# Patient Record
Sex: Female | Born: 1975 | Hispanic: Yes | Marital: Single | Smoking: Never smoker
Health system: Southern US, Community
[De-identification: ages and names within clinical notes are randomized; demographics above are authoritative.]

## PROBLEM LIST (undated history)

## (undated) DIAGNOSIS — E079 Disorder of thyroid, unspecified: Secondary | ICD-10-CM

## (undated) DIAGNOSIS — R7303 Prediabetes: Secondary | ICD-10-CM

## (undated) HISTORY — PX: CHOLECYSTECTOMY: SHX55

---

## 2007-03-22 ENCOUNTER — Ambulatory Visit: Payer: Self-pay | Admitting: General Surgery

## 2007-04-13 ENCOUNTER — Ambulatory Visit: Payer: Self-pay | Admitting: General Surgery

## 2007-04-15 ENCOUNTER — Ambulatory Visit: Payer: Self-pay | Admitting: General Surgery

## 2009-05-01 ENCOUNTER — Ambulatory Visit: Payer: Self-pay | Admitting: Advanced Practice Midwife

## 2009-05-21 ENCOUNTER — Ambulatory Visit: Payer: Self-pay | Admitting: Obstetrics and Gynecology

## 2009-05-22 ENCOUNTER — Inpatient Hospital Stay: Payer: Self-pay

## 2014-08-07 ENCOUNTER — Ambulatory Visit: Payer: Self-pay | Admitting: Obstetrics and Gynecology

## 2014-08-14 ENCOUNTER — Ambulatory Visit: Payer: Self-pay | Admitting: Obstetrics and Gynecology

## 2014-09-19 ENCOUNTER — Ambulatory Visit: Payer: Self-pay | Admitting: Obstetrics and Gynecology

## 2015-04-25 ENCOUNTER — Encounter: Payer: Self-pay | Admitting: Emergency Medicine

## 2015-04-25 ENCOUNTER — Emergency Department
Admission: EM | Admit: 2015-04-25 | Discharge: 2015-04-25 | Discharge status: Against Medical Advice | Payer: BLUE CROSS/BLUE SHIELD | Attending: Emergency Medicine | Admitting: Emergency Medicine

## 2015-04-25 DIAGNOSIS — R110 Nausea: Secondary | ICD-10-CM | POA: Insufficient documentation

## 2015-04-25 DIAGNOSIS — Z9049 Acquired absence of other specified parts of digestive tract: Secondary | ICD-10-CM | POA: Diagnosis not present

## 2015-04-25 DIAGNOSIS — R1010 Upper abdominal pain, unspecified: Secondary | ICD-10-CM | POA: Insufficient documentation

## 2015-04-25 LAB — CBC
HCT: 41.4 % (ref 35.0–47.0)
Hemoglobin: 13.7 g/dL (ref 12.0–16.0)
MCH: 27.7 pg (ref 26.0–34.0)
MCHC: 33 g/dL (ref 32.0–36.0)
MCV: 83.9 fL (ref 80.0–100.0)
PLATELETS: 315 10*3/uL (ref 150–440)
RBC: 4.93 MIL/uL (ref 3.80–5.20)
RDW: 13.5 % (ref 11.5–14.5)
WBC: 7.9 10*3/uL (ref 3.6–11.0)

## 2015-04-25 LAB — URINALYSIS COMPLETE WITH MICROSCOPIC (ARMC ONLY)
BILIRUBIN URINE: NEGATIVE
Glucose, UA: NEGATIVE mg/dL
Ketones, ur: NEGATIVE mg/dL
Leukocytes, UA: NEGATIVE
Nitrite: NEGATIVE
PH: 5 (ref 5.0–8.0)
Protein, ur: NEGATIVE mg/dL
SPECIFIC GRAVITY, URINE: 1.017 (ref 1.005–1.030)

## 2015-04-25 LAB — COMPREHENSIVE METABOLIC PANEL
ALT: 15 U/L (ref 14–54)
ANION GAP: 7 (ref 5–15)
AST: 17 U/L (ref 15–41)
Albumin: 4.3 g/dL (ref 3.5–5.0)
Alkaline Phosphatase: 65 U/L (ref 38–126)
BUN: 6 mg/dL (ref 6–20)
CALCIUM: 8.6 mg/dL — AB (ref 8.9–10.3)
CO2: 26 mmol/L (ref 22–32)
Chloride: 105 mmol/L (ref 101–111)
Creatinine, Ser: 0.61 mg/dL (ref 0.44–1.00)
GFR calc non Af Amer: >60 mL/min (ref >60)
GLUCOSE: 99 mg/dL (ref 65–99)
Potassium: 3.7 mmol/L (ref 3.5–5.1)
Sodium: 138 mmol/L (ref 135–145)
TOTAL PROTEIN: 7.7 g/dL (ref 6.5–8.1)
Total Bilirubin: 0.3 mg/dL (ref 0.3–1.2)

## 2015-04-25 LAB — LIPASE, BLOOD: Lipase: 28 U/L (ref 22–51)

## 2015-04-25 NOTE — ED Notes (Signed)
Patient to ED with c/o upper abdominal pain for 3 days, has history of gall bladder removal. Patient c/o constipation. +nausea when she eats.

## 2017-11-30 DIAGNOSIS — M9901 Segmental and somatic dysfunction of cervical region: Secondary | ICD-10-CM | POA: Diagnosis not present

## 2017-11-30 DIAGNOSIS — R51 Headache: Secondary | ICD-10-CM | POA: Diagnosis not present

## 2017-11-30 DIAGNOSIS — M5412 Radiculopathy, cervical region: Secondary | ICD-10-CM | POA: Diagnosis not present

## 2017-12-01 DIAGNOSIS — M5412 Radiculopathy, cervical region: Secondary | ICD-10-CM | POA: Diagnosis not present

## 2017-12-01 DIAGNOSIS — R51 Headache: Secondary | ICD-10-CM | POA: Diagnosis not present

## 2017-12-01 DIAGNOSIS — M9901 Segmental and somatic dysfunction of cervical region: Secondary | ICD-10-CM | POA: Diagnosis not present

## 2017-12-02 DIAGNOSIS — M9901 Segmental and somatic dysfunction of cervical region: Secondary | ICD-10-CM | POA: Diagnosis not present

## 2017-12-02 DIAGNOSIS — M5412 Radiculopathy, cervical region: Secondary | ICD-10-CM | POA: Diagnosis not present

## 2017-12-02 DIAGNOSIS — R51 Headache: Secondary | ICD-10-CM | POA: Diagnosis not present

## 2017-12-03 DIAGNOSIS — M5412 Radiculopathy, cervical region: Secondary | ICD-10-CM | POA: Diagnosis not present

## 2017-12-03 DIAGNOSIS — M9901 Segmental and somatic dysfunction of cervical region: Secondary | ICD-10-CM | POA: Diagnosis not present

## 2017-12-03 DIAGNOSIS — R51 Headache: Secondary | ICD-10-CM | POA: Diagnosis not present

## 2017-12-06 DIAGNOSIS — R51 Headache: Secondary | ICD-10-CM | POA: Diagnosis not present

## 2017-12-06 DIAGNOSIS — M9901 Segmental and somatic dysfunction of cervical region: Secondary | ICD-10-CM | POA: Diagnosis not present

## 2017-12-06 DIAGNOSIS — M5412 Radiculopathy, cervical region: Secondary | ICD-10-CM | POA: Diagnosis not present

## 2017-12-08 DIAGNOSIS — M9901 Segmental and somatic dysfunction of cervical region: Secondary | ICD-10-CM | POA: Diagnosis not present

## 2017-12-08 DIAGNOSIS — M5412 Radiculopathy, cervical region: Secondary | ICD-10-CM | POA: Diagnosis not present

## 2017-12-08 DIAGNOSIS — R51 Headache: Secondary | ICD-10-CM | POA: Diagnosis not present

## 2017-12-09 DIAGNOSIS — R51 Headache: Secondary | ICD-10-CM | POA: Diagnosis not present

## 2017-12-09 DIAGNOSIS — M5412 Radiculopathy, cervical region: Secondary | ICD-10-CM | POA: Diagnosis not present

## 2017-12-09 DIAGNOSIS — M9901 Segmental and somatic dysfunction of cervical region: Secondary | ICD-10-CM | POA: Diagnosis not present

## 2017-12-13 DIAGNOSIS — M5412 Radiculopathy, cervical region: Secondary | ICD-10-CM | POA: Diagnosis not present

## 2017-12-13 DIAGNOSIS — R51 Headache: Secondary | ICD-10-CM | POA: Diagnosis not present

## 2017-12-13 DIAGNOSIS — M9901 Segmental and somatic dysfunction of cervical region: Secondary | ICD-10-CM | POA: Diagnosis not present

## 2017-12-15 DIAGNOSIS — R51 Headache: Secondary | ICD-10-CM | POA: Diagnosis not present

## 2017-12-15 DIAGNOSIS — M9901 Segmental and somatic dysfunction of cervical region: Secondary | ICD-10-CM | POA: Diagnosis not present

## 2017-12-15 DIAGNOSIS — M5412 Radiculopathy, cervical region: Secondary | ICD-10-CM | POA: Diagnosis not present

## 2017-12-20 DIAGNOSIS — M9901 Segmental and somatic dysfunction of cervical region: Secondary | ICD-10-CM | POA: Diagnosis not present

## 2017-12-20 DIAGNOSIS — M5412 Radiculopathy, cervical region: Secondary | ICD-10-CM | POA: Diagnosis not present

## 2017-12-20 DIAGNOSIS — R51 Headache: Secondary | ICD-10-CM | POA: Diagnosis not present

## 2017-12-22 DIAGNOSIS — M9901 Segmental and somatic dysfunction of cervical region: Secondary | ICD-10-CM | POA: Diagnosis not present

## 2017-12-22 DIAGNOSIS — R51 Headache: Secondary | ICD-10-CM | POA: Diagnosis not present

## 2017-12-22 DIAGNOSIS — M5412 Radiculopathy, cervical region: Secondary | ICD-10-CM | POA: Diagnosis not present

## 2017-12-23 DIAGNOSIS — M9901 Segmental and somatic dysfunction of cervical region: Secondary | ICD-10-CM | POA: Diagnosis not present

## 2017-12-23 DIAGNOSIS — R51 Headache: Secondary | ICD-10-CM | POA: Diagnosis not present

## 2017-12-23 DIAGNOSIS — M5412 Radiculopathy, cervical region: Secondary | ICD-10-CM | POA: Diagnosis not present

## 2017-12-27 DIAGNOSIS — R51 Headache: Secondary | ICD-10-CM | POA: Diagnosis not present

## 2017-12-27 DIAGNOSIS — M9901 Segmental and somatic dysfunction of cervical region: Secondary | ICD-10-CM | POA: Diagnosis not present

## 2017-12-27 DIAGNOSIS — M5412 Radiculopathy, cervical region: Secondary | ICD-10-CM | POA: Diagnosis not present

## 2017-12-29 DIAGNOSIS — M5412 Radiculopathy, cervical region: Secondary | ICD-10-CM | POA: Diagnosis not present

## 2017-12-29 DIAGNOSIS — M9901 Segmental and somatic dysfunction of cervical region: Secondary | ICD-10-CM | POA: Diagnosis not present

## 2017-12-29 DIAGNOSIS — R51 Headache: Secondary | ICD-10-CM | POA: Diagnosis not present

## 2018-01-03 DIAGNOSIS — R51 Headache: Secondary | ICD-10-CM | POA: Diagnosis not present

## 2018-01-03 DIAGNOSIS — M9901 Segmental and somatic dysfunction of cervical region: Secondary | ICD-10-CM | POA: Diagnosis not present

## 2018-01-03 DIAGNOSIS — M5412 Radiculopathy, cervical region: Secondary | ICD-10-CM | POA: Diagnosis not present

## 2018-01-05 DIAGNOSIS — R51 Headache: Secondary | ICD-10-CM | POA: Diagnosis not present

## 2018-01-05 DIAGNOSIS — M5412 Radiculopathy, cervical region: Secondary | ICD-10-CM | POA: Diagnosis not present

## 2018-01-05 DIAGNOSIS — M9901 Segmental and somatic dysfunction of cervical region: Secondary | ICD-10-CM | POA: Diagnosis not present

## 2018-01-10 DIAGNOSIS — R51 Headache: Secondary | ICD-10-CM | POA: Diagnosis not present

## 2018-01-10 DIAGNOSIS — M5412 Radiculopathy, cervical region: Secondary | ICD-10-CM | POA: Diagnosis not present

## 2018-01-10 DIAGNOSIS — M9901 Segmental and somatic dysfunction of cervical region: Secondary | ICD-10-CM | POA: Diagnosis not present

## 2018-01-13 DIAGNOSIS — R51 Headache: Secondary | ICD-10-CM | POA: Diagnosis not present

## 2018-01-13 DIAGNOSIS — M9901 Segmental and somatic dysfunction of cervical region: Secondary | ICD-10-CM | POA: Diagnosis not present

## 2018-01-13 DIAGNOSIS — M5412 Radiculopathy, cervical region: Secondary | ICD-10-CM | POA: Diagnosis not present

## 2018-01-17 DIAGNOSIS — M9901 Segmental and somatic dysfunction of cervical region: Secondary | ICD-10-CM | POA: Diagnosis not present

## 2018-01-17 DIAGNOSIS — M5412 Radiculopathy, cervical region: Secondary | ICD-10-CM | POA: Diagnosis not present

## 2018-01-17 DIAGNOSIS — R51 Headache: Secondary | ICD-10-CM | POA: Diagnosis not present

## 2018-01-20 DIAGNOSIS — R51 Headache: Secondary | ICD-10-CM | POA: Diagnosis not present

## 2018-01-20 DIAGNOSIS — M9901 Segmental and somatic dysfunction of cervical region: Secondary | ICD-10-CM | POA: Diagnosis not present

## 2018-01-20 DIAGNOSIS — M5412 Radiculopathy, cervical region: Secondary | ICD-10-CM | POA: Diagnosis not present

## 2018-06-10 DIAGNOSIS — Z0000 Encounter for general adult medical examination without abnormal findings: Secondary | ICD-10-CM | POA: Diagnosis not present

## 2018-06-27 DIAGNOSIS — B373 Candidiasis of vulva and vagina: Secondary | ICD-10-CM | POA: Diagnosis not present

## 2018-08-10 DIAGNOSIS — M9902 Segmental and somatic dysfunction of thoracic region: Secondary | ICD-10-CM | POA: Diagnosis not present

## 2018-08-10 DIAGNOSIS — M62830 Muscle spasm of back: Secondary | ICD-10-CM | POA: Diagnosis not present

## 2018-08-10 DIAGNOSIS — M5431 Sciatica, right side: Secondary | ICD-10-CM | POA: Diagnosis not present

## 2018-08-13 DIAGNOSIS — M5441 Lumbago with sciatica, right side: Secondary | ICD-10-CM | POA: Diagnosis not present

## 2018-11-16 DIAGNOSIS — R05 Cough: Secondary | ICD-10-CM | POA: Diagnosis not present

## 2018-11-16 DIAGNOSIS — J069 Acute upper respiratory infection, unspecified: Secondary | ICD-10-CM | POA: Diagnosis not present

## 2018-12-05 ENCOUNTER — Other Ambulatory Visit: Payer: Self-pay | Admitting: Internal Medicine

## 2018-12-05 DIAGNOSIS — Z1231 Encounter for screening mammogram for malignant neoplasm of breast: Secondary | ICD-10-CM

## 2018-12-05 DIAGNOSIS — M79671 Pain in right foot: Secondary | ICD-10-CM | POA: Diagnosis not present

## 2018-12-05 DIAGNOSIS — M79641 Pain in right hand: Secondary | ICD-10-CM | POA: Diagnosis not present

## 2018-12-05 DIAGNOSIS — M25551 Pain in right hip: Secondary | ICD-10-CM | POA: Diagnosis not present

## 2018-12-08 DIAGNOSIS — G8929 Other chronic pain: Secondary | ICD-10-CM | POA: Diagnosis not present

## 2018-12-08 DIAGNOSIS — Z7689 Persons encountering health services in other specified circumstances: Secondary | ICD-10-CM | POA: Diagnosis not present

## 2018-12-08 DIAGNOSIS — R7309 Other abnormal glucose: Secondary | ICD-10-CM | POA: Diagnosis not present

## 2018-12-08 DIAGNOSIS — Z13220 Encounter for screening for lipoid disorders: Secondary | ICD-10-CM | POA: Diagnosis not present

## 2018-12-08 DIAGNOSIS — M25551 Pain in right hip: Secondary | ICD-10-CM | POA: Diagnosis not present

## 2019-03-16 ENCOUNTER — Other Ambulatory Visit: Payer: Self-pay

## 2019-03-16 ENCOUNTER — Ambulatory Visit
Admission: RE | Admit: 2019-03-16 | Discharge: 2019-03-16 | Disposition: A | Discharge status: Home / Self Care | Payer: 59 | Source: Ambulatory Visit | Attending: Internal Medicine | Admitting: Internal Medicine

## 2019-03-16 DIAGNOSIS — Z1231 Encounter for screening mammogram for malignant neoplasm of breast: Secondary | ICD-10-CM | POA: Insufficient documentation

## 2019-05-24 ENCOUNTER — Other Ambulatory Visit: Payer: Self-pay

## 2019-05-24 ENCOUNTER — Encounter: Payer: Self-pay | Admitting: Emergency Medicine

## 2019-05-24 ENCOUNTER — Emergency Department: Payer: 59

## 2019-05-24 ENCOUNTER — Emergency Department
Admission: EM | Admit: 2019-05-24 | Discharge: 2019-05-24 | Disposition: A | Discharge status: Home / Self Care | Payer: 59 | Attending: Student | Admitting: Student

## 2019-05-24 DIAGNOSIS — O4690 Antepartum hemorrhage, unspecified, unspecified trimester: Secondary | ICD-10-CM

## 2019-05-24 DIAGNOSIS — R8271 Bacteriuria: Secondary | ICD-10-CM

## 2019-05-24 DIAGNOSIS — Z3A00 Weeks of gestation of pregnancy not specified: Secondary | ICD-10-CM | POA: Diagnosis not present

## 2019-05-24 DIAGNOSIS — O209 Hemorrhage in early pregnancy, unspecified: Secondary | ICD-10-CM | POA: Diagnosis not present

## 2019-05-24 LAB — URINALYSIS, COMPLETE (UACMP) WITH MICROSCOPIC
Bilirubin Urine: NEGATIVE
Glucose, UA: NEGATIVE mg/dL
Hgb urine dipstick: NEGATIVE
Ketones, ur: NEGATIVE mg/dL
Leukocytes,Ua: NEGATIVE
Nitrite: NEGATIVE
Protein, ur: NEGATIVE mg/dL
Specific Gravity, Urine: 1.019 (ref 1.005–1.030)
pH: 6 (ref 5.0–8.0)

## 2019-05-24 LAB — CBC
HCT: 41.3 % (ref 36.0–46.0)
Hemoglobin: 13.3 g/dL (ref 12.0–15.0)
MCH: 28.2 pg (ref 26.0–34.0)
MCHC: 32.2 g/dL (ref 30.0–36.0)
MCV: 87.7 fL (ref 80.0–100.0)
Platelets: 367 10*3/uL (ref 150–400)
RBC: 4.71 MIL/uL (ref 3.87–5.11)
RDW: 13.7 % (ref 11.5–15.5)
WBC: 10.6 10*3/uL — ABNORMAL HIGH (ref 4.0–10.5)
nRBC: 0 % (ref 0.0–0.2)

## 2019-05-24 LAB — COMPREHENSIVE METABOLIC PANEL
ALT: 15 U/L (ref 0–44)
AST: 15 U/L (ref 15–41)
Albumin: 4 g/dL (ref 3.5–5.0)
Alkaline Phosphatase: 58 U/L (ref 38–126)
Anion gap: 8 (ref 5–15)
BUN: 17 mg/dL (ref 6–20)
CO2: 22 mmol/L (ref 22–32)
Calcium: 9.1 mg/dL (ref 8.9–10.3)
Chloride: 107 mmol/L (ref 98–111)
Creatinine, Ser: 0.63 mg/dL (ref 0.44–1.00)
GFR calc Af Amer: >60 mL/min (ref >60)
GFR calc non Af Amer: >60 mL/min (ref >60)
Glucose, Bld: 107 mg/dL — ABNORMAL HIGH (ref 70–99)
Potassium: 4 mmol/L (ref 3.5–5.1)
Sodium: 137 mmol/L (ref 135–145)
Total Bilirubin: 0.5 mg/dL (ref 0.3–1.2)
Total Protein: 7.1 g/dL (ref 6.5–8.1)

## 2019-05-24 LAB — WET PREP, GENITAL
Clue Cells Wet Prep HPF POC: NONE SEEN
Sperm: NONE SEEN
Trich, Wet Prep: NONE SEEN
Yeast Wet Prep HPF POC: NONE SEEN

## 2019-05-24 LAB — POCT PREGNANCY, URINE: Preg Test, Ur: POSITIVE — AB

## 2019-05-24 LAB — HCG, QUANTITATIVE, PREGNANCY: hCG, Beta Chain, Quant, S: 5742 m[IU]/mL — ABNORMAL HIGH (ref <5)

## 2019-05-24 MED ORDER — CEPHALEXIN 500 MG PO CAPS: 500.0000 mg | ORAL_CAPSULE | Freq: Two times a day (BID) | ORAL | 0 refills | Status: AC

## 2019-05-24 NOTE — ED Notes (Signed)
Interpretor requested 

## 2019-05-24 NOTE — ED Notes (Signed)
MD Monks and this RN at bedside for update and discharge with interpretor on a stick

## 2019-05-24 NOTE — Discharge Instructions (Signed)
You for letting us take care of you in the emergency department today.  It is important that you follow-up with OB/GYN in 2 days.  Someone from [facility] will call you to make this appointment. Their address is [address], [place], [place] [address]. Their phone number is: [phone].  This appointment is important for follow-up of the status of your pregnancy.  You also had bacteria in your urine, for this we will send you home with antibiotics.  Please return to the emergency department for any new or worsening symptoms.   Usted por dejarnos atenderlo en el departamento de emergencias hoy.  Es importante que realice un seguimiento con su Producer, television/film/video / gineclogo en 2 das. Alguien de [facility] la llamar para programar esta cita. Su direccin es [address], [place], [place] [address]. Su nmero de telfono es: [phone].  Esta cita es importante para el seguimiento del [place] de su [place].  Tambin tenas bacterias en tu orina, para esto te enviaremos a casa con antibiticos.  Regrese al departamento de emergencias por cualquier sntoma nuevo o que empeore.

## 2019-05-24 NOTE — ED Triage Notes (Addendum)
Positive preg last week.  Spotting since Monday( brownish)  Also cramping

## 2019-05-24 NOTE — ED Provider Notes (Signed)
[facility] Emergency Department Provider Note  ____________________________________________   First MD Initiated Contact with Patient 05/24/19 1105     (approximate)  I have reviewed the triage vital signs and the nursing notes.  History  Chief Complaint Vaginal Bleeding    HPI Becky Garrett is a 43 y.o. female [phone] [id number] who presents to the emergency department for vaginal bleeding in the setting of pregnancy.  Patient states her last menstrual period was on 03/18/19.  She had a positive home pregnancy test 2 weeks ago.  On Sunday, she began noticing a small amount of brown discharge, only while wiping.  No brisk bleeding or clots.  She has not had to wear any pads or tampons.  She is not on any anticoagulation.  She denies any other vaginal discharge.  History obtained with the assistance of an interpreter.         Past Medical Hx History reviewed. No pertinent past medical history.  Problem List There are no active problems to display for this patient.   Past Surgical Hx Past Surgical History:  Procedure Laterality Date  . CHOLECYSTECTOMY      Medications Prior to Admission medications   Not on File    Allergies Patient has no known allergies.  Family Hx No family history on file.  Social Hx Social History   Tobacco Use  . Smoking status: Never Smoker  . Smokeless tobacco: Never Used  Substance Use Topics  . Alcohol use: No  . Drug use: No     Review of Systems  Constitutional: Negative for fever. Negative for chills. Eyes: Negative for visual changes. ENT: Negative for sore throat. Cardiovascular: Negative for chest pain. Respiratory: Negative for shortness of breath. Gastrointestinal: Negative for abdominal pain. Negative for nausea. Negative for vomiting. Genitourinary: Negative for dysuria. + vaginal bleeding Musculoskeletal: Negative for leg swelling. Skin: Negative for rash. Neurological: Negative for  for headaches.   Physical Exam  Vital Signs: ED Triage Vitals [05/24/19 0938]  Enc Vitals Group     BP 117/76     Pulse Rate 86     Resp 16     Temp 98.5 F (36.9 C)     Temp Source Oral     SpO2 100 %     Weight      Height      Head Circumference      Peak Flow      Pain Score 4     Pain Loc      Pain Edu?      Excl. in GC?     Constitutional: Alert and oriented.  Eyes: Conjunctivae clear. Sclera anicteric. Head: Normocephalic. Atraumatic. Nose: No congestion. No rhinorrhea. Mouth/Throat: Mucous membranes are moist.  Neck: No stridor.   Cardiovascular: Normal rate, regular rhythm. No murmurs. Extremities well perfused. Respiratory: Normal respiratory effort.  Lungs CTAB. Gastrointestinal: Soft and non-tender. No distention.  Pelvic: RN chaperone present, scant amount of brown blood, mucus in vaginal canal.  No active bleeding.  No clots.  Cervix appears visually closed. Musculoskeletal: No lower extremity edema. Neurologic:  Normal speech and language. No gross focal neurologic deficits are appreciated.  Skin: Skin is warm, dry and intact. No rash noted. Psychiatric: Mood and affect are appropriate for situation.  EKG  N/A    Radiology  US: IMPRESSION: Gestational sac without definitive fetal pole or yolk sac. Correlation with beta HCG levels is recommended. Short-term follow-up may be helpful.     Procedures  Procedure(s)  performed (including critical care):  Procedures   Initial Impression / Assessment and Plan / ED Course  43 y.o. female who presents to the ED for vaginal bleeding and pregnancy.  LMP 7/4.  Had a positive home UPT approximately 2 weeks ago.  History as above.  Ddx: threatened miscarriage, miscarriage, ectopic  Plan: Labs, ultrasound  Labs reveal asymptomatic bacteriuria, given she is pregnant we will plan to treat.  Wet prep is negative.  Ultrasound notable for gestational sac, without a definitive fetal pole or yolk sac.   Updated patient on results and explained that she had an pregnancy of unknown viability, which will require close follow-up with OB/GYN.  Discussed with [facility], who will plan to see the patient in 2 days in clinic for follow-up and recheck.  Updated patient on this plan of care with the assistance of an interpreter.  She voices understanding and is comfortable with the plan and discharge.  All questions answered.    Final Clinical Impression(s) / ED Diagnosis  Final diagnoses:  Vaginal bleeding in pregnancy  Bacteria in urine       Note:  This document was prepared using Dragon voice recognition software and may include unintentional dictation errors.   Lilia Pro., MD 05/24/19 [phone]

## 2019-05-25 ENCOUNTER — Encounter: Payer: Self-pay | Admitting: Emergency Medicine

## 2019-05-25 ENCOUNTER — Other Ambulatory Visit: Payer: Self-pay

## 2019-05-25 ENCOUNTER — Emergency Department
Admission: EM | Admit: 2019-05-25 | Discharge: 2019-05-25 | Disposition: A | Discharge status: Home / Self Care | Payer: 59 | Attending: Emergency Medicine | Admitting: Emergency Medicine

## 2019-05-25 DIAGNOSIS — N939 Abnormal uterine and vaginal bleeding, unspecified: Secondary | ICD-10-CM | POA: Diagnosis not present

## 2019-05-25 DIAGNOSIS — Z5321 Procedure and treatment not carried out due to patient leaving prior to being seen by health care provider: Secondary | ICD-10-CM | POA: Diagnosis not present

## 2019-05-25 NOTE — ED Triage Notes (Signed)
Pt seen here yesterday for cramping and brownish discharge, today, red vaginal bleeding today and more pain. NAD. Pt states this is her 4th pregnancy and hasn't bled like this before.

## 2019-05-26 LAB — GC/CHLAMYDIA PROBE AMP
Chlamydia trachomatis, NAA: NEGATIVE
Neisseria Gonorrhoeae by PCR: NEGATIVE

## 2020-03-11 ENCOUNTER — Other Ambulatory Visit: Payer: Self-pay

## 2020-03-11 ENCOUNTER — Ambulatory Visit: Payer: Self-pay

## 2020-03-11 DIAGNOSIS — Z021 Encounter for pre-employment examination: Secondary | ICD-10-CM

## 2020-03-11 LAB — POCT URINE DRUG SCREEN
POC Amphetamine UR: NOT DETECTED
POC Cocaine UR: NOT DETECTED
POC Methamphetamine UR: NOT DETECTED
POC Opiate Ur: NOT DETECTED
POC PHENCYCLIDINE UR: NOT DETECTED
URINE TEMPERATURE: 92 Degrees F (ref 90.0–100.0)

## 2020-09-03 ENCOUNTER — Other Ambulatory Visit: Payer: Self-pay | Admitting: Obstetrics and Gynecology

## 2020-09-03 DIAGNOSIS — Z1231 Encounter for screening mammogram for malignant neoplasm of breast: Secondary | ICD-10-CM

## 2020-09-18 ENCOUNTER — Encounter: Payer: Self-pay | Admitting: Emergency Medicine

## 2020-09-18 ENCOUNTER — Other Ambulatory Visit: Payer: Self-pay

## 2020-09-18 ENCOUNTER — Ambulatory Visit
Admission: EM | Admit: 2020-09-18 | Discharge: 2020-09-18 | Disposition: A | Discharge status: Home / Self Care | Payer: BC Managed Care – PPO | Attending: Emergency Medicine | Admitting: Emergency Medicine

## 2020-09-18 DIAGNOSIS — R590 Localized enlarged lymph nodes: Secondary | ICD-10-CM | POA: Diagnosis present

## 2020-09-18 DIAGNOSIS — B349 Viral infection, unspecified: Secondary | ICD-10-CM | POA: Diagnosis present

## 2020-09-18 DIAGNOSIS — Z20822 Contact with and (suspected) exposure to covid-19: Secondary | ICD-10-CM | POA: Insufficient documentation

## 2020-09-18 NOTE — Discharge Instructions (Addendum)
Use over-the-counter Tylenol and ibuprofen as needed for pain control.  If you develop any trouble breathing return for reevaluation or go to the ER.  You will need to isolate at home until the results of the Covid test come back.  If you are positive you will have to quarantine for 10 days from the start of your symptoms.  After 10 days you can break quarantine if your symptoms have improved and you have not run a fever for 24 hours.

## 2020-09-18 NOTE — ED Provider Notes (Signed)
[facility]    CSN: [id number] Arrival date & time: 09/18/20  1524      History   Chief Complaint Chief Complaint  Patient presents with  . Generalized Body Aches    HPI Becky Garrett is a 45 y.o. female.   HPI   45 year old Hispanic speaking female here for evaluation of generalized body aches and left-sided neck swelling.  Patient reports that she woke up this morning with swelling to left side of her neck that was very painful.  Patient states that it hurts to swallow.  Patient also reports that she has had some associated fatigue.  Patient denies fever, runny nose, trouble swallowing, cough, GI complaints, or abdominal pain.  Patient was exposed to Covid around Christmas time at a party.  Currently her husband and son have similar symptoms along with nausea and vomiting.  Patient has been vaccinated against Covid but has not received her booster shot.  Spanish interpreter [place] [phone] assisted in HPI, assessment, and discharge.  History reviewed. No pertinent past medical history.  There are no problems to display for this patient.   Past Surgical History:  Procedure Laterality Date  . CHOLECYSTECTOMY      OB History   No obstetric history on file.      Home Medications    Prior to Admission medications   Not on File    Family History History reviewed. No pertinent family history.  Social History Social History   Tobacco Use  . Smoking status: Never Smoker  . Smokeless tobacco: Never Used  Substance Use Topics  . Alcohol use: No  . Drug use: No     Allergies   Patient has no known allergies.   Review of Systems Review of Systems  Constitutional: Negative for fever.  HENT: Negative for congestion, rhinorrhea and sore throat.   Respiratory: Negative for cough and shortness of breath.   Gastrointestinal: Negative for abdominal pain, diarrhea, nausea and vomiting.  Musculoskeletal: Positive for neck pain. Negative  for arthralgias and myalgias.  Skin: Negative for color change and rash.  Hematological: Positive for adenopathy.  Psychiatric/Behavioral: Negative.      Physical Exam Triage Vital Signs ED Triage Vitals  Enc Vitals Group     BP 09/18/20 1907 (!) 162/95     Pulse Rate 09/18/20 1907 (!) 101     Resp 09/18/20 1907 18     Temp 09/18/20 1907 98.5 F (36.9 C)     Temp Source 09/18/20 1907 Oral     SpO2 --      Weight 09/18/20 1905 207 lb 14.3 oz (94.3 kg)     Height 09/18/20 1905 5' (1.524 m)     Head Circumference --      Peak Flow --      Pain Score 09/18/20 1904 7     Pain Loc --      Pain Edu? --      Excl. in GC? --    No data found.  Updated Vital Signs BP (!) 162/95 (BP Location: Left Arm)   Pulse (!) 101   Temp 98.5 F (36.9 C) (Oral)   Resp 18   Ht 5' (1.524 m)   Wt 207 lb 14.3 oz (94.3 kg)   LMP 09/10/2020   BMI 40.60 kg/m   Visual Acuity Right Eye Distance:   Left Eye Distance:   Bilateral Distance:    Right Eye Near:   Left Eye Near:    Bilateral Near:  Physical Exam Vitals and nursing note reviewed.  Constitutional:      General: She is not in acute distress.    Appearance: Normal appearance.  HENT:     Head: Normocephalic and atraumatic.     Right Ear: Tympanic membrane, ear canal and external ear normal.     Left Ear: Tympanic membrane, ear canal and external ear normal.     Nose: Nose normal. No congestion or rhinorrhea.     Mouth/Throat:     Mouth: Mucous membranes are moist.     Pharynx: Oropharynx is clear. No posterior oropharyngeal erythema.  Cardiovascular:     Rate and Rhythm: Normal rate and regular rhythm.     Pulses: Normal pulses.     Heart sounds: Normal heart sounds. No murmur heard. No gallop.   Pulmonary:     Effort: Pulmonary effort is normal.     Breath sounds: Normal breath sounds. No stridor. No wheezing, rhonchi or rales.  Musculoskeletal:     Cervical back: Normal range of motion and neck supple. Tenderness  present.  Lymphadenopathy:     Cervical: Cervical adenopathy present.  Skin:    General: Skin is warm and dry.     Capillary Refill: Capillary refill takes less than 2 seconds.     Findings: No erythema or rash.  Neurological:     General: No focal deficit present.     Mental Status: She is alert and oriented to person, place, and time.  Psychiatric:        Mood and Affect: Mood normal.        Behavior: Behavior normal.        Thought Content: Thought content normal.        Judgment: Judgment normal.      UC Treatments / Results  Labs (all labs ordered are listed, but only abnormal results are displayed) Labs Reviewed  SARS CORONAVIRUS 2 (TAT 6-24 HRS)    EKG   Radiology No results found.  Procedures Procedures (including critical care time)  Medications Ordered in UC Medications - No data to display  Initial Impression / Assessment and Plan / UC Course  I have reviewed the triage vital signs and the nursing notes.  Pertinent labs & imaging results that were available during my care of the patient were reviewed by me and considered in my medical decision making (see chart for details).   Is here for evaluation of swelling to the left side of her neck and generalized body aches that started this morning.  Patient does have marked edema to the left lower side of her neck near her clavicle.  The area is freely mobile but tender to touch.  No erythema.  The borders of the swollen area are palpable.  There is no induration or fluctuance noted.  Physical exam of upper respiratory tree is benign, lung sounds are clear and equal bilaterally.  No stridor appreciated when auscultating over the trachea.  Suspect that this is a singular reactive lymph node.  Will swab patient for Covid and discharge her home to isolate.  Patient is able to manage her secretions and is not having any difficulty swallowing or breathing.  We will have her use over-the-counter ibuprofen for pain relief and  have her return for reevaluation or see the ER if her symptoms worsen.   Final Clinical Impressions(s) / UC Diagnoses   Final diagnoses:  Viral illness  Reactive cervical lymphadenopathy     Discharge Instructions     Use  over-the-counter Tylenol and ibuprofen as needed for pain control.  If you develop any trouble breathing return for reevaluation or go to the ER.  You will need to isolate at home until the results of the Covid test come back.  If you are positive you will have to quarantine for 10 days from the start of your symptoms.  After 10 days you can break quarantine if your symptoms have improved and you have not run a fever for 24 hours.    ED Prescriptions    None     PDMP not reviewed this encounter.   Becky Augusta, NP 09/18/20 1945

## 2020-09-18 NOTE — ED Triage Notes (Signed)
Pt c/o body aches and left sided neck swelling. She was exposed to covid.

## 2020-09-19 LAB — SARS CORONAVIRUS 2 (TAT 6-24 HRS): SARS Coronavirus 2: NEGATIVE

## 2020-09-20 ENCOUNTER — Other Ambulatory Visit: Payer: Self-pay

## 2020-09-20 ENCOUNTER — Ambulatory Visit
Admission: RE | Admit: 2020-09-20 | Discharge: 2020-09-20 | Disposition: A | Discharge status: Home / Self Care | Payer: BC Managed Care – PPO | Source: Ambulatory Visit | Attending: Family Medicine | Admitting: Family Medicine

## 2020-09-20 ENCOUNTER — Ambulatory Visit: Payer: BC Managed Care – PPO

## 2020-09-20 ENCOUNTER — Ambulatory Visit
Admission: EM | Admit: 2020-09-20 | Discharge: 2020-09-20 | Disposition: A | Discharge status: Home / Self Care | Payer: BC Managed Care – PPO | Attending: Family Medicine | Admitting: Family Medicine

## 2020-09-20 DIAGNOSIS — I889 Nonspecific lymphadenitis, unspecified: Secondary | ICD-10-CM

## 2020-09-20 DIAGNOSIS — R221 Localized swelling, mass and lump, neck: Secondary | ICD-10-CM

## 2020-09-20 DIAGNOSIS — R599 Enlarged lymph nodes, unspecified: Secondary | ICD-10-CM

## 2020-09-20 LAB — POCT RAPID STREP A (OFFICE): Rapid Strep A Screen: NEGATIVE

## 2020-09-20 MED ORDER — AMOXICILLIN-POT CLAVULANATE 875-125 MG PO TABS: 1.0000 | ORAL_TABLET | Freq: Two times a day (BID) | ORAL | 0 refills | Status: DC

## 2020-09-20 MED ORDER — DEXAMETHASONE SODIUM PHOSPHATE 10 MG/ML IJ SOLN
10.0000 mg | Freq: Once | INTRAMUSCULAR | Status: AC
  Administered 2020-09-20: 10 mg via INTRAMUSCULAR

## 2020-09-20 NOTE — ED Triage Notes (Signed)
Patient presents to Urgent Care with complaints of sore throat and low grade fever since weds. Had a covid test at the [facility] taking tylenol for discomfort.   Denies abdominal pain, n/v, or diarrhea.

## 2020-09-20 NOTE — Discharge Instructions (Addendum)
Steroid injection given here for pain, inflammation and swelling.  We will go ahead and trial some antibiotics to see if this helps.  I have put order in for an ultrasound.  You may get this next week if your symptoms are getting better or resolved by then we may cancel the ultrasound.

## 2020-09-23 NOTE — ED Provider Notes (Signed)
Becky Garrett    CSN: [id number] Arrival date & time: 09/20/20  1302      History   Chief Complaint Chief Complaint  Patient presents with  . Sore Throat    HPI Becky Garrett is a 45 y.o. female.   Patient is a 45 year old female that presents today with complaints of left neck swelling, pain.  This is been present for the past 3 to 4 days.  Was seen at urgent care and told to take Tylenol for discomfort due to possible lymphadenopathy.  She has had low-grade fevers.  No trouble swallowing or breathing.  No history of thyroid issues.  No other concerns     History reviewed. No pertinent past medical history.  There are no problems to display for this patient.   Past Surgical History:  Procedure Laterality Date  . CHOLECYSTECTOMY      OB History   No obstetric history on file.      Home Medications    Prior to Admission medications   Medication Sig Start Date End Date Taking? Authorizing Provider  amoxicillin-clavulanate (AUGMENTIN) 875-125 MG tablet Take 1 tablet by mouth every 12 (twelve) hours. 09/20/20  Yes Janace Aris, NP    Family History History reviewed. No pertinent family history.  Social History Social History   Tobacco Use  . Smoking status: Never Smoker  . Smokeless tobacco: Never Used  Substance Use Topics  . Alcohol use: No  . Drug use: No     Allergies   Patient has no known allergies.   Review of Systems Review of Systems   Physical Exam Triage Vital Signs ED Triage Vitals  Enc Vitals Group     BP 09/20/20 1337 126/86     Pulse Rate 09/20/20 1337 98     Resp 09/20/20 1337 16     Temp 09/20/20 1337 97.9 F (36.6 C)     Temp Source 09/20/20 1337 Temporal     SpO2 09/20/20 1337 98 %     Weight --      Height --      Head Circumference --      Peak Flow --      Pain Score 09/20/20 1458 6     Pain Loc --      Pain Edu? --      Excl. in GC? --    No data found.  Updated Vital Signs BP 126/86 (BP  Location: Left Arm)   Pulse 98   Temp 97.9 F (36.6 C) (Temporal)   Resp 16   LMP 09/10/2020   SpO2 98%   Visual Acuity Right Eye Distance:   Left Eye Distance:   Bilateral Distance:    Right Eye Near:   Left Eye Near:    Bilateral Near:     Physical Exam Vitals and nursing note reviewed.  Constitutional:      General: She is not in acute distress.    Appearance: Normal appearance. She is not ill-appearing, toxic-appearing or diaphoretic.  HENT:     Head: Normocephalic.     Nose: Nose normal.     Mouth/Throat:     Pharynx: Oropharynx is clear.  Eyes:     Conjunctiva/sclera: Conjunctivae normal.  Neck:      Comments: Approximated 3 to 4 cm soft palpable nodule to left neck area.  No erythema, drainage Pulmonary:     Effort: Pulmonary effort is normal.  Musculoskeletal:        General: Normal range  of motion.     Cervical back: Normal range of motion.  Skin:    General: Skin is warm and dry.     Findings: No rash.  Neurological:     Mental Status: She is alert.  Psychiatric:        Mood and Affect: Mood normal.      UC Treatments / Results  Labs (all labs ordered are listed, but only abnormal results are displayed) Labs Reviewed  POCT RAPID STREP A (OFFICE)    EKG   Radiology No results found.  Procedures Procedures (including critical care time)  Medications Ordered in UC Medications  dexamethasone (DECADRON) injection 10 mg (10 mg Intramuscular Given 09/20/20 1456)    Initial Impression / Assessment and Plan / UC Course  I have reviewed the triage vital signs and the nursing notes.  Pertinent labs & imaging results that were available during my care of the patient were reviewed by me and considered in my medical decision making (see chart for details).     Neck swelling Possible lymphadenitis Rapid strep test negative here today. Sending patient for ultrasound to further evaluate nodule We will give steroid injection here for pain,  swelling and inflammation Decided to trial a round of antibiotics in case this was some sort of infectious process. Follow up as needed for continued or worsening symptoms  Final Clinical Impressions(s) / UC Diagnoses   Final diagnoses:  Neck swelling  Lymphadenitis     Discharge Instructions     Steroid injection given here for pain, inflammation and swelling.  We will go ahead and trial some antibiotics to see if this helps.  I have put order in for an ultrasound.  You may get this next week if your symptoms are getting better or resolved by then we may cancel the ultrasound.    ED Prescriptions    Medication Sig Dispense Auth. Provider   amoxicillin-clavulanate (AUGMENTIN) 875-125 MG tablet Take 1 tablet by mouth every 12 (twelve) hours. 14 tablet Marley Pakula A, NP     PDMP not reviewed this encounter.   Janace Aris, NP 09/23/20 [phone]

## 2020-10-11 ENCOUNTER — Other Ambulatory Visit: Payer: Self-pay

## 2020-10-11 ENCOUNTER — Ambulatory Visit
Admission: RE | Admit: 2020-10-11 | Discharge: 2020-10-11 | Disposition: A | Discharge status: Home / Self Care | Payer: BC Managed Care – PPO | Source: Ambulatory Visit | Attending: Obstetrics and Gynecology | Admitting: Obstetrics and Gynecology

## 2020-10-11 DIAGNOSIS — Z1231 Encounter for screening mammogram for malignant neoplasm of breast: Secondary | ICD-10-CM | POA: Diagnosis present

## 2021-10-02 ENCOUNTER — Other Ambulatory Visit: Payer: Self-pay | Admitting: Obstetrics and Gynecology

## 2021-10-02 DIAGNOSIS — Z1231 Encounter for screening mammogram for malignant neoplasm of breast: Secondary | ICD-10-CM

## 2021-11-04 ENCOUNTER — Ambulatory Visit
Admission: RE | Admit: 2021-11-04 | Discharge: 2021-11-04 | Disposition: A | Discharge status: Home / Self Care | Payer: BC Managed Care – PPO | Source: Ambulatory Visit | Attending: Obstetrics and Gynecology | Admitting: Obstetrics and Gynecology

## 2021-11-04 ENCOUNTER — Other Ambulatory Visit: Payer: Self-pay

## 2021-11-04 DIAGNOSIS — Z1231 Encounter for screening mammogram for malignant neoplasm of breast: Secondary | ICD-10-CM | POA: Insufficient documentation

## 2022-01-16 ENCOUNTER — Encounter: Payer: Self-pay | Admitting: Intensive Care

## 2022-01-16 ENCOUNTER — Encounter: Payer: Self-pay | Admitting: Emergency Medicine

## 2022-01-16 ENCOUNTER — Emergency Department
Admission: EM | Admit: 2022-01-16 | Discharge: 2022-01-16 | Disposition: A | Discharge status: Home / Self Care | Payer: BC Managed Care – PPO | Attending: Emergency Medicine | Admitting: Emergency Medicine

## 2022-01-16 ENCOUNTER — Emergency Department: Payer: BC Managed Care – PPO

## 2022-01-16 ENCOUNTER — Other Ambulatory Visit: Payer: Self-pay

## 2022-01-16 ENCOUNTER — Ambulatory Visit
Admission: EM | Admit: 2022-01-16 | Discharge: 2022-01-16 | Disposition: A | Discharge status: Home / Self Care | Payer: BC Managed Care – PPO | Attending: Emergency Medicine | Admitting: Emergency Medicine

## 2022-01-16 DIAGNOSIS — K2900 Acute gastritis without bleeding: Secondary | ICD-10-CM | POA: Insufficient documentation

## 2022-01-16 DIAGNOSIS — R110 Nausea: Secondary | ICD-10-CM | POA: Insufficient documentation

## 2022-01-16 DIAGNOSIS — N3000 Acute cystitis without hematuria: Secondary | ICD-10-CM | POA: Diagnosis not present

## 2022-01-16 DIAGNOSIS — R1010 Upper abdominal pain, unspecified: Secondary | ICD-10-CM | POA: Insufficient documentation

## 2022-01-16 DIAGNOSIS — R1013 Epigastric pain: Secondary | ICD-10-CM | POA: Diagnosis not present

## 2022-01-16 HISTORY — DX: Disorder of thyroid, unspecified: E07.9

## 2022-01-16 HISTORY — DX: Prediabetes: R73.03

## 2022-01-16 LAB — COMPREHENSIVE METABOLIC PANEL
ALT: 19 U/L (ref 0–44)
AST: 19 U/L (ref 15–41)
Albumin: 4 g/dL (ref 3.5–5.0)
Alkaline Phosphatase: 73 U/L (ref 38–126)
Anion gap: 8 (ref 5–15)
BUN: 11 mg/dL (ref 6–20)
CO2: 24 mmol/L (ref 22–32)
Calcium: 8.2 mg/dL — ABNORMAL LOW (ref 8.9–10.3)
Chloride: 102 mmol/L (ref 98–111)
Creatinine, Ser: 0.6 mg/dL (ref 0.44–1.00)
GFR, Estimated: >60 mL/min (ref >60)
Glucose, Bld: 117 mg/dL — ABNORMAL HIGH (ref 70–99)
Potassium: 3.9 mmol/L (ref 3.5–5.1)
Sodium: 134 mmol/L — ABNORMAL LOW (ref 135–145)
Total Bilirubin: 0.6 mg/dL (ref 0.3–1.2)
Total Protein: 7.4 g/dL (ref 6.5–8.1)

## 2022-01-16 LAB — CBC
HCT: 40.3 % (ref 36.0–46.0)
Hemoglobin: 12.9 g/dL (ref 12.0–15.0)
MCH: 27.3 pg (ref 26.0–34.0)
MCHC: 32 g/dL (ref 30.0–36.0)
MCV: 85.2 fL (ref 80.0–100.0)
Platelets: 436 10*3/uL — ABNORMAL HIGH (ref 150–400)
RBC: 4.73 MIL/uL (ref 3.87–5.11)
RDW: 13.3 % (ref 11.5–15.5)
WBC: 10.5 10*3/uL (ref 4.0–10.5)
nRBC: 0 % (ref 0.0–0.2)

## 2022-01-16 LAB — POCT URINALYSIS DIP (MANUAL ENTRY)
Bilirubin, UA: NEGATIVE
Glucose, UA: NEGATIVE mg/dL
Ketones, POC UA: NEGATIVE mg/dL
Leukocytes, UA: NEGATIVE
Nitrite, UA: POSITIVE — AB
Spec Grav, UA: 1.02 (ref 1.010–1.025)
Urobilinogen, UA: 0.2 E.U./dL
pH, UA: 5.5 (ref 5.0–8.0)

## 2022-01-16 LAB — POCT URINE PREGNANCY: Preg Test, Ur: NEGATIVE

## 2022-01-16 LAB — TROPONIN I (HIGH SENSITIVITY): Troponin I (High Sensitivity): <2 ng/L (ref <18)

## 2022-01-16 LAB — HCG, QUANTITATIVE, PREGNANCY: hCG, Beta Chain, Quant, S: <1 m[IU]/mL (ref <5)

## 2022-01-16 LAB — LIPASE, BLOOD: Lipase: 32 U/L (ref 11–51)

## 2022-01-16 MED ORDER — LIDOCAINE VISCOUS HCL 2 % MT SOLN
15.0000 mL | Freq: Once | OROMUCOSAL | Status: AC
  Administered 2022-01-16: 15 mL via ORAL

## 2022-01-16 MED ORDER — TRAMADOL HCL 50 MG PO TABS
50.0000 mg | ORAL_TABLET | Freq: Once | ORAL | Status: AC
  Administered 2022-01-16: 50 mg via ORAL
  Filled 2022-01-16: qty 1

## 2022-01-16 MED ORDER — FAMOTIDINE 20 MG PO TABS: 20.0000 mg | ORAL_TABLET | Freq: Two times a day (BID) | ORAL | 1 refills | Status: AC

## 2022-01-16 MED ORDER — CEPHALEXIN 500 MG PO CAPS: 500.0000 mg | ORAL_CAPSULE | Freq: Three times a day (TID) | ORAL | 0 refills | Status: AC

## 2022-01-16 MED ORDER — CEPHALEXIN 500 MG PO CAPS
500.0000 mg | ORAL_CAPSULE | Freq: Once | ORAL | Status: AC
  Administered 2022-01-16: 500 mg via ORAL
  Filled 2022-01-16: qty 1

## 2022-01-16 MED ORDER — IOHEXOL 300 MG/ML  SOLN
100.0000 mL | Freq: Once | INTRAMUSCULAR | Status: AC | PRN
  Administered 2022-01-16: 100 mL via INTRAVENOUS
  Filled 2022-01-16: qty 100

## 2022-01-16 MED ORDER — ALUM & MAG HYDROXIDE-SIMETH 200-200-20 MG/5ML PO SUSP
30.0000 mL | Freq: Once | ORAL | Status: AC
  Administered 2022-01-16: 30 mL via ORAL

## 2022-01-16 MED ORDER — TRAMADOL HCL 50 MG PO TABS: 50.0000 mg | ORAL_TABLET | Freq: Three times a day (TID) | ORAL | 0 refills | Status: AC | PRN

## 2022-01-16 MED ORDER — FAMOTIDINE IN NACL 20-0.9 MG/50ML-% IV SOLN
20.0000 mg | Freq: Once | INTRAVENOUS | Status: AC
  Administered 2022-01-16: 20 mg via INTRAVENOUS
  Filled 2022-01-16: qty 50

## 2022-01-16 MED ORDER — ONDANSETRON 4 MG PO TBDP
4.0000 mg | ORAL_TABLET | Freq: Once | ORAL | Status: AC
  Administered 2022-01-16: 4 mg via ORAL
  Filled 2022-01-16: qty 1

## 2022-01-16 MED ORDER — ONDANSETRON 4 MG PO TBDP: 4.0000 mg | ORAL_TABLET | Freq: Three times a day (TID) | ORAL | 0 refills | Status: AC | PRN

## 2022-01-16 NOTE — ED Triage Notes (Addendum)
Patient in office today c/o abd./back pain with nausea x 1d ?States that she feels as if she needs to burb ?OTC: tylenol around 7-8 a.m ? ?Denies: Fever, vomiting ?

## 2022-01-16 NOTE — Discharge Instructions (Addendum)
Your exam, labs, ultrasound and CT are normal and reassuring at this time.  No signs of any stones in the duct or the upper stomach.  You do have evidence of a urinary tract infection from earlier today.  Take antibiotic as prescribed.  Take the stomach acid medicine as directed.  Take the pain medicine nausea medicine as needed.  Follow-up with your primary provider for ongoing symptoms.  Return to the ED if needed. ? ?Su examen, laboratorios, ultrasonido y tomograf?a computarizada son normales y tranquilizadores en este momento. No hay signos de piedras en el conducto o en la parte superior del est?mago. Tiene evidencia de una infecci?n del tracto urinario de hoy. Tome el antibi?tico seg?n lo prescrito. Tome el [place]. Tome el medicamento para el dolor para las n?useas seg?n sea necesario. Haga un seguimiento con su proveedor primario para los s?ntomas continuos. Regrese al servicio de urgencias si es necesario.  ?

## 2022-01-16 NOTE — ED Notes (Signed)
PT provided with dc ppw. Pt  questions answered.  Pt follow up and rx information reviewed. Pt  declines vs at dc. Pt  provides verbal consent for dc and is ambulatory to lobby on foot alert and oriented x4. ?

## 2022-01-16 NOTE — ED Triage Notes (Signed)
Patient c/o epigastric pain that started yesterday. Was told at [facility] she has urinary infection and sent here. Also reports bloating ?

## 2022-01-16 NOTE — ED Notes (Signed)
Informed patient family is calling ER desk and requesting patient to call them ?

## 2022-01-16 NOTE — Discharge Instructions (Addendum)
Go to the emergency department for evaluation of your abdominal pain and other symptoms. 

## 2022-01-16 NOTE — ED Notes (Signed)
See triage note  presents with some epigastric pain which radiates into back  pain started last pm  became worse today  afebrile on arrival  ?

## 2022-01-16 NOTE — ED Provider Notes (Signed)
? ? ?[facility] ?Emergency Department Provider Note ? ? ? ? Event Date/Time  ? First MD Initiated Contact with Patient 01/16/22 1617   ?  (approximate) ? ? ?History  ? ?Abdominal Pain ? ?HPI ? ?Interpreter used for interview and exam.  Tele-interpreter used for interim disposition ? ?Becky Garrett is a 46 y.o. female with a history of thyroid disease and s/p cholecystectomy, presents from the [facility].  He presented there today after noting onset of epigastric pain yesterday.  Patient was evaluated at the urgent care, and found to have nitrite positive urine but also evaluation revealing epigastric abdominal discomfort.  Patient notes associated nausea without vomiting, and some referred epigastric pain to the mid back.  She denies any fever, chills, sweats, frank vomiting, or bowel changes.  Patient presents to the ED for further evaluation after a GI cocktail provided in the urgent care did not provide any benefit. ? ? ?Physical Exam  ? ?Triage Vital Signs: ?ED Triage Vitals  ?Enc Vitals Group  ?   BP 01/16/22 1416 136/89  ?   Pulse Rate 01/16/22 1416 (!) 106  ?   Resp 01/16/22 1416 18  ?   Temp 01/16/22 1416 98.5 ?F (36.9 ?C)  ?   Temp Source 01/16/22 1416 Oral  ?   SpO2 01/16/22 1416 100 %  ?   Weight 01/16/22 1425 200 lb (90.7 kg)  ?   Height 01/16/22 1425 5' (1.524 m)  ?   Head Circumference --   ?   Peak Flow --   ?   Pain Score 01/16/22 1425 7  ?   Pain Loc --   ?   Pain Edu? --   ?   Excl. in GC? --   ? ? ?Most recent vital signs: ?Vitals:  ? 01/16/22 1615 01/16/22 2150  ?BP: 130/80 128/78  ?Pulse: 100 80  ?Resp: 18 18  ?Temp:    ?SpO2: 100% 99%  ? ? ?General Awake, no distress.  ?CV:  Good peripheral perfusion.  ?RESP:  Normal effort. CTA ?ABD:  No distention. Soft, mildly tender to palp over the epigastrium. Normal bowel sounds ? ? ?ED Results / Procedures / Treatments  ? ?Labs ?(all labs ordered are listed, but only abnormal results are displayed) ?Labs Reviewed   ?COMPREHENSIVE METABOLIC PANEL - Abnormal; Notable for the following components:  ?    Result Value  ? Sodium 134 (*)   ? Glucose, Bld 117 (*)   ? Calcium 8.2 (*)   ? All other components within normal limits  ?CBC - Abnormal; Notable for the following components:  ? Platelets 436 (*)   ? All other components within normal limits  ?LIPASE, BLOOD  ?HCG, QUANTITATIVE, PREGNANCY  ?URINALYSIS, ROUTINE W REFLEX MICROSCOPIC  ?POC URINE PREG, ED  ?TROPONIN I (HIGH SENSITIVITY)  ? ? ? ?EKG ? ?Vent. rate 103 BPM ?PR interval 162 ms ?QRS duration 70 ms ?QT/QTcB 320/419 ms ?P-R-T axes 64 12 14 ?Sinus tachy rate ?No STEMI ? ?RADIOLOGY ? ?I personally viewed and evaluated these images as part of my medical decision making, as well as reviewing the written report by the radiologist. ? ?ED Provider Interpretation: no acute findings} ? ?CT ABDOMEN PELVIS W CONTRAST ? ?Result Date: 01/16/2022 ?CLINICAL DATA:  Acute abdominal and back pain. EXAM: CT ABDOMEN AND PELVIS WITH CONTRAST TECHNIQUE: Multidetector CT imaging of the abdomen and pelvis was performed using the standard protocol following bolus administration of intravenous contrast. RADIATION DOSE REDUCTION:  This exam was performed according to the departmental dose-optimization program which includes automated exposure control, adjustment of the mA and/or kV according to patient size and/or use of iterative reconstruction technique. CONTRAST:  100mL OMNIPAQUE IOHEXOL 300 MG/ML  SOLN COMPARISON:  None Available. FINDINGS: Lower Chest: No acute findings. Hepatobiliary: No hepatic masses identified. Prior cholecystectomy. No evidence of biliary obstruction. Pancreas:  No mass or inflammatory changes. Spleen: Within normal limits in size and appearance. Adrenals/Urinary Tract: No masses identified. No evidence of ureteral calculi or hydronephrosis. Stomach/Bowel: No evidence of obstruction, inflammatory process or abnormal fluid collections. Normal appendix visualized.  Vascular/Lymphatic: No pathologically enlarged lymph nodes. No acute vascular findings. Reproductive:  No mass or other significant abnormality. Other:  None. Musculoskeletal:  No suspicious bone lesions identified. IMPRESSION: Negative. No acute findings or other significant abnormality. Electronically Signed   By: Danae OrleansJohn A Stahl M.D.   On: 01/16/2022 18:35  ? ?US Abdomen Limited RUQ (LIVER/GB) ? ?Result Date: 01/16/2022 ?CLINICAL DATA:  Epigastric abdominal pain. Status post cholecystectomy. EXAM: ULTRASOUND ABDOMEN LIMITED RIGHT UPPER QUADRANT COMPARISON:  None Available. FINDINGS: Gallbladder: Surgically absent. Common bile duct: Diameter: 6 mm, within normal limits. No intrahepatic biliary ductal dilatation. Liver: Moderately increased echogenicity throughout the liver compatible with fatty infiltration. Smooth liver contours. No focal liver lesion is seen. Portal vein is patent on color Doppler imaging with normal direction of blood flow towards the liver. Other: None. IMPRESSION:: IMPRESSION: 1. Status post cholecystectomy. 2. Fatty liver. Electronically Signed   By: Neita Garnetonald  Viola M.D.   On: 01/16/2022 17:02   ? ? ?PROCEDURES: ? ?Critical Care performed: No ? ?Procedures ? ? ?MEDICATIONS ORDERED IN ED: ?Medications  ?ondansetron (ZOFRAN-ODT) disintegrating tablet 4 mg (4 mg Oral Given 01/16/22 1832)  ?traMADol (ULTRAM) tablet 50 mg (50 mg Oral Given 01/16/22 1832)  ?iohexol (OMNIPAQUE) 300 MG/ML solution 100 mL (100 mLs Intravenous Contrast Given 01/16/22 1814)  ?famotidine (PEPCID) IVPB 20 mg premix (0 mg Intravenous Stopped 01/16/22 2137)  ?cephALEXin (KEFLEX) capsule 500 mg (500 mg Oral Given 01/16/22 2136)  ? ? ? ?IMPRESSION / MDM / ASSESSMENT AND PLAN / ED COURSE  ?I reviewed the triage vital signs and the nursing notes. ?             ?               ? ?Differential diagnosis includes, but is not limited to, biliary disease (biliary colic, acute cholecystitis, cholangitis, choledocholithiasis, etc), intrathoracic  causes for epigastric abdominal pain including ACS, gastritis, duodenitis, pancreatitis, small bowel or large bowel obstruction, abdominal aortic aneurysm, hernia, and ulcer(s). ? ?----------------------------------------- ?9:26 PM on 01/16/2022 ?----------------------------------------- ?Patient reporting improvement of abdominal pain following IV famotidine.  ? ?Patient to the ED from urgent care for evaluation of epigastric pain for the last 24 to 48 hours.  Patient presents with reports of nausea without vomiting and pain that is familiar to her, when she had gallstones.  She presents with some epigastric discomfort with referral to the back.  Patient was evaluated for complaints with routine labs which were normal and reassuring at this time.  Urinalysis from the urgent care did show nitrite positive urine.  No ultrasound evidence of any biliary ductal dilatation.  Patient is status postcholecystectomy.  Further evaluation with abdominal CT with contrast did not reveal any acute intraluminal process to explain the patient's symptoms.  Troponin is normal and EKG is reassuring as are low concern for ACS in this patient.  No signs of any patient's diagnosis  is consistent with gastritis. Patient will be discharged home with prescriptions for famotidine, Zofran, tramadol, and Keflex. Patient is to follow up with primary provider as needed or otherwise directed. Patient is given ED precautions to return to the ED for any worsening or new symptoms. ? ? ?FINAL CLINICAL IMPRESSION(S) / ED DIAGNOSES  ? ?Final diagnoses:  ?Acute gastritis without hemorrhage, unspecified gastritis type  ?Acute cystitis without hematuria  ? ? ? ?Rx / DC Orders  ? ?ED Discharge Orders   ? ?      Ordered  ?  ondansetron (ZOFRAN-ODT) 4 MG disintegrating tablet  Every 8 hours PRN       ? 01/16/22 2131  ?  famotidine (PEPCID) 20 MG tablet  2 times daily       ? 01/16/22 2131  ?  traMADol (ULTRAM) 50 MG tablet  3 times daily PRN       ? 01/16/22  2131  ?  cephALEXin (KEFLEX) 500 MG capsule  3 times daily       ? 01/16/22 2132  ? ?  ?  ? ?  ? ? ? ?Note:  This document was prepared using Dragon voice recognition software and may include unintentional dictation errors. ? ?

## 2022-01-16 NOTE — ED Notes (Signed)
First Nurse Note:  Pt to ED via POV from Urgent Care for abdominal pain and back pain that did not improve with GI cocktail. Pt sent for further work up. Pt is in NAD.  ?

## 2022-01-16 NOTE — ED Provider Notes (Signed)
?[facility] ? ? ? ?CSN: [id number] ?Arrival date & time: 01/16/22  1231 ? ? ?  ? ?History   ?Chief Complaint ?Chief Complaint  ?Patient presents with  ? Abdominal Pain  ? Back Pain  ? ? ?HPI ?Becky Garrett is a 46 y.o. female.  Patient presents with upper abdominal pain radiating to her mid back since yesterday.  She feels nauseated and bloated.  Treatment at home with Tylenol taken this morning.  She denies fever, vomiting, diarrhea, constipation, dysuria, hematuria, vaginal discharge, pelvic pain, or other symptoms.  Last bowel movement this morning and was normal.  She denies pregnancy or breast-feeding. ? ?The history is provided by the patient. A language interpreter was used.  ? ?History reviewed. No pertinent past medical history. ? ?There are no problems to display for this patient. ? ? ?Past Surgical History:  ?Procedure Laterality Date  ? CHOLECYSTECTOMY    ? ? ?OB History   ?No obstetric history on file. ?  ? ? ? ?Home Medications   ? ?Prior to Admission medications   ?Medication Sig Start Date End Date Taking? Authorizing Provider  ?amoxicillin-clavulanate (AUGMENTIN) 875-125 MG tablet Take 1 tablet by mouth every 12 (twelve) hours. 09/20/20   Dahlia Byes A, NP  ?metFORMIN (GLUCOPHAGE) 500 MG tablet TAKE 1 TABLET BY MOUTH EVERY MORNING AND TAKE 2 TABLETS BY MOUTH EVERY EVENING 10/07/21   [provider]  ? ? ?Family History ?Family History  ?Problem Relation Age of Onset  ? Breast cancer Neg Hx   ? ? ?Social History ?Social History  ? ?Tobacco Use  ? Smoking status: Never  ? Smokeless tobacco: Never  ?Substance Use Topics  ? Alcohol use: No  ? Drug use: No  ? ? ? ?Allergies   ?Patient has no known allergies. ? ? ?Review of Systems ?Review of Systems  ?Constitutional:  Negative for chills and fever.  ?Respiratory:  Negative for cough and shortness of breath.   ?Cardiovascular:  Negative for chest pain and palpitations.  ?Gastrointestinal:  Positive for abdominal pain and nausea.  Negative for constipation, diarrhea and vomiting.  ?Genitourinary:  Negative for dysuria, flank pain, hematuria, pelvic pain and vaginal discharge.  ?Musculoskeletal:  Positive for back pain. Negative for gait problem.  ?Skin:  Negative for color change and rash.  ?All other systems reviewed and are negative. ? ? ?Physical Exam ?Triage Vital Signs ?ED Triage Vitals  ?Enc Vitals Group  ?   BP   ?   Pulse   ?   Resp   ?   Temp   ?   Temp src   ?   SpO2   ?   Weight   ?   Height   ?   Head Circumference   ?   Peak Flow   ?   Pain Score   ?   Pain Loc   ?   Pain Edu?   ?   Excl. in GC?   ? ?No data found. ? ?Updated Vital Signs ?BP 116/82   Pulse (!) 116   Temp 98.1 ?F (36.7 ?C)   Resp 18   Ht 5' (1.524 m)   SpO2 97%   BMI 40.60 kg/m?  ? ?Visual Acuity ?Right Eye Distance:   ?Left Eye Distance:   ?Bilateral Distance:   ? ?Right Eye Near:   ?Left Eye Near:    ?Bilateral Near:    ? ?Physical Exam ?Vitals and nursing note reviewed.  ?Constitutional:   ?  General: She is not in acute distress. ?   Appearance: She is well-developed. She is obese. She is not ill-appearing.  ?HENT:  ?   Mouth/Throat:  ?   Mouth: Mucous membranes are moist.  ?Cardiovascular:  ?   Rate and Rhythm: Regular rhythm. Tachycardia present.  ?   Heart sounds: Normal heart sounds.  ?Pulmonary:  ?   Effort: Pulmonary effort is normal. No respiratory distress.  ?   Breath sounds: Normal breath sounds.  ?Abdominal:  ?   General: Bowel sounds are normal.  ?   Palpations: Abdomen is soft.  ?   Tenderness: There is abdominal tenderness in the epigastric area. There is no right CVA tenderness, left CVA tenderness, guarding or rebound.  ?   Comments: Mild epigastric tenderness to palpation.  No rebound or guarding.  No CVAT.  ?Musculoskeletal:  ?   Cervical back: Neck supple.  ?Skin: ?   General: Skin is warm and dry.  ?Neurological:  ?   Mental Status: She is alert.  ?Psychiatric:     ?   Mood and Affect: Mood normal.     ?   Behavior: Behavior normal.   ? ? ? ?UC Treatments / Results  ?Labs ?(all labs ordered are listed, but only abnormal results are displayed) ?Labs Reviewed  ?POCT URINALYSIS DIP (MANUAL ENTRY) - Abnormal; Notable for the following components:  ?    Result Value  ? Blood, UA trace-intact (*)   ? Protein Ur, POC trace (*)   ? Nitrite, UA Positive (*)   ? All other components within normal limits  ?URINE CULTURE  ?POCT URINE PREGNANCY  ? ? ?EKG ? ? ?Radiology ?No results found. ? ?Procedures ?Procedures (including critical care time) ? ?Medications Ordered in UC ?Medications  ?alum & mag hydroxide-simeth (MAALOX/MYLANTA) 200-200-20 MG/5ML suspension 30 mL (30 mLs Oral Given 01/16/22 1343)  ?  And  ?lidocaine (XYLOCAINE) 2 % viscous mouth solution 15 mL (15 mLs Oral Given 01/16/22 1343)  ? ? ?Initial Impression / Assessment and Plan / UC Course  ?I have reviewed the triage vital signs and the nursing notes. ? ?Pertinent labs & imaging results that were available during my care of the patient were reviewed by me and considered in my medical decision making (see chart for details). ? ?Upper / Epigastric abdominal pain.  Nausea without vomiting.   ?Urine culture pending.  EKG shows sinus tachycardia, rate 103, no ST elevation, no previous to compare.  Patient has 8/10 abdominal pain radiating to her back.  No improvement with GI cocktail.  Sending her to the ED for evaluation.  She drove herself here and feels stable to drive herself to the ED.  She declines EMS. ? ? ?Final Clinical Impressions(s) / UC Diagnoses  ? ?Final diagnoses:  ?Epigastric pain  ?Pain of upper abdomen  ?Nausea without vomiting  ? ? ? ?Discharge Instructions   ? ?  ?Go to the emergency department for evaluation of your abdominal pain and other symptoms.   ? ? ? ? ? ? ?ED Prescriptions   ?None ?  ? ?PDMP not reviewed this encounter. ?  ?Mickie Bail, NP ?01/16/22 1348 ? ?

## 2022-01-18 LAB — URINE CULTURE: Culture: >=100000 — AB

## 2022-10-08 ENCOUNTER — Other Ambulatory Visit: Payer: Self-pay | Admitting: Obstetrics and Gynecology

## 2022-10-08 DIAGNOSIS — Z1231 Encounter for screening mammogram for malignant neoplasm of breast: Secondary | ICD-10-CM

## 2023-01-16 IMAGING — CT CT ABD-PELV W/ CM
2 of 5 series · 16 of 46 positions shown, 18 images · IV contrast (APPLIED)
Comparison: None Available.

CLINICAL DATA: Acute abdominal and back pain.

EXAM:
CT ABDOMEN AND PELVIS WITH CONTRAST
TECHNIQUE: Multidetector CT imaging of the abdomen and pelvis was performed
using the standard protocol following bolus administration of
intravenous contrast.

[Series 2: abdomen 5.0 · axial · 0.83mm/px · z∈[-906,-486]mm · 13 of 99 slices shown, 15 images]
[im 8/99  soft-tissue]
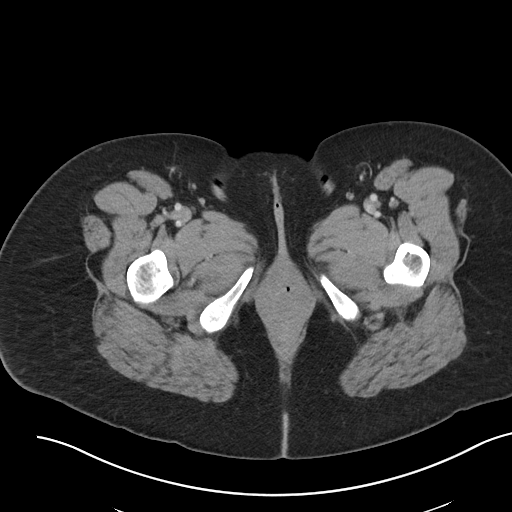
[im 8/99  bone]
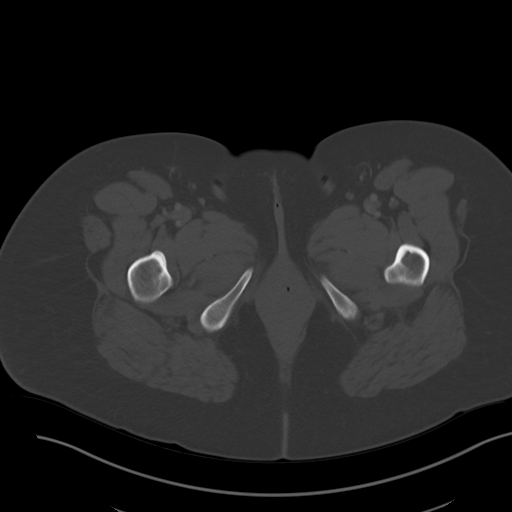
[im 15/99  soft-tissue]
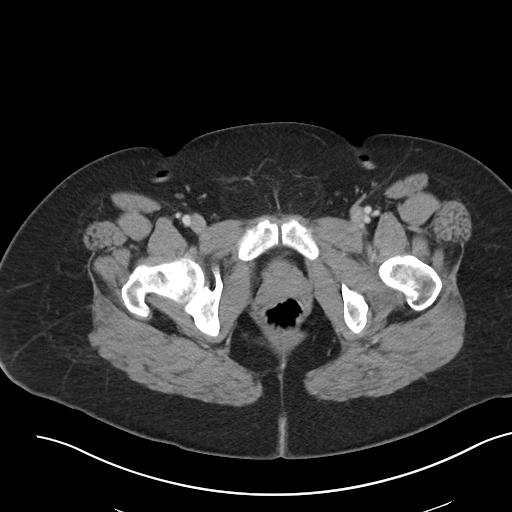
[im 22/99  soft-tissue]
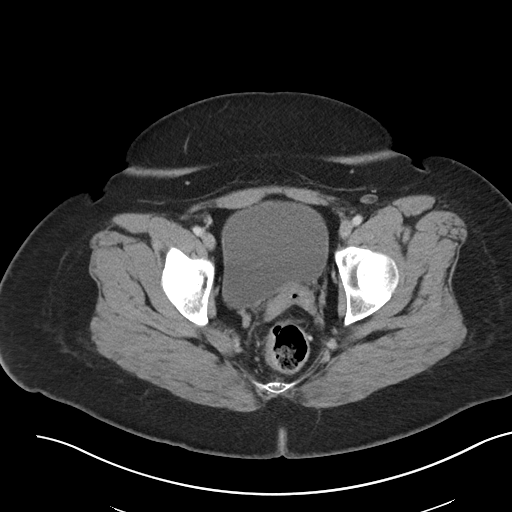
[im 29/99  soft-tissue]
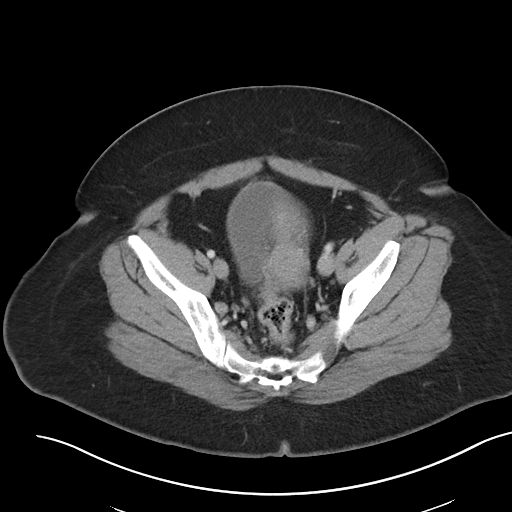
[im 36/99  soft-tissue]
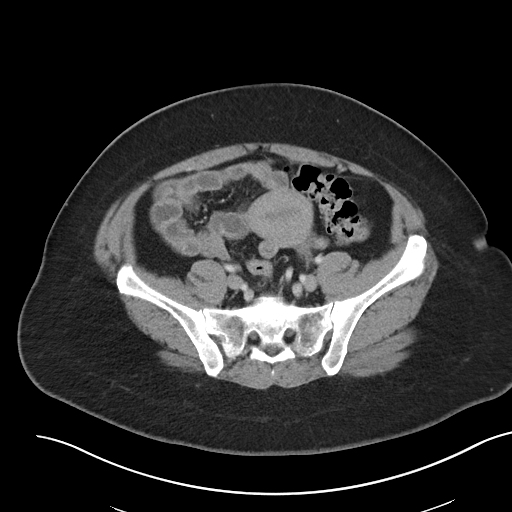
[im 43/99  soft-tissue]
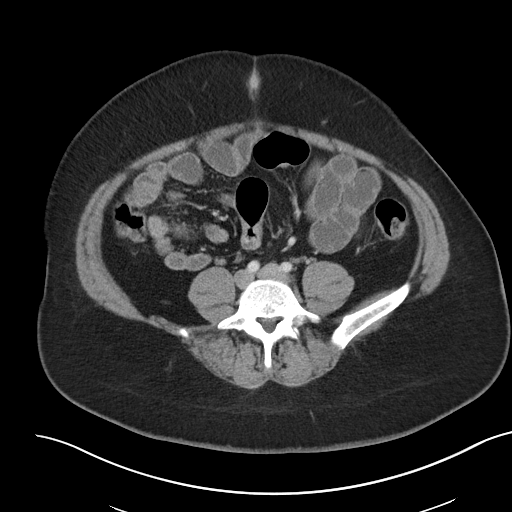
[im 50/99  soft-tissue]
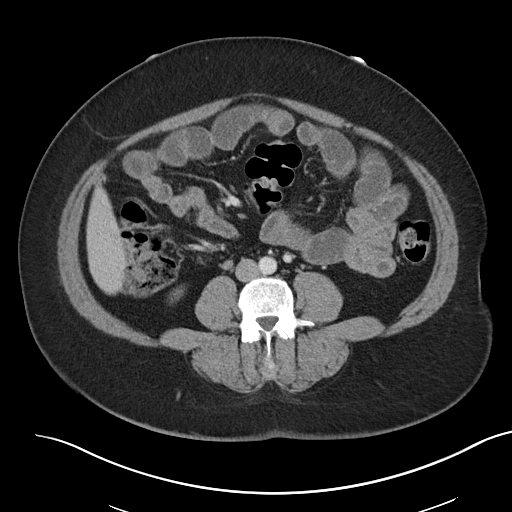
[im 57/99  soft-tissue]
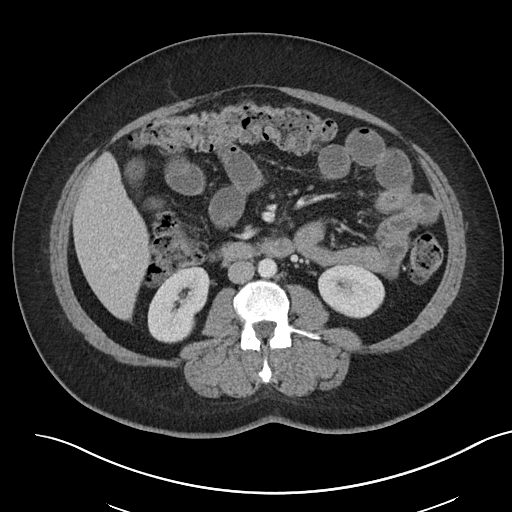
[im 64/99  soft-tissue]
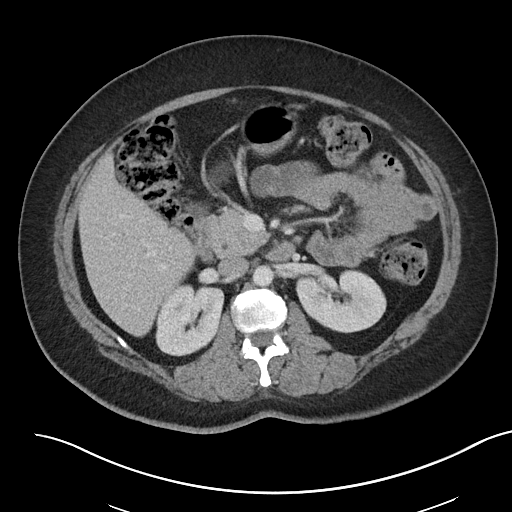
[im 64/99  bone]
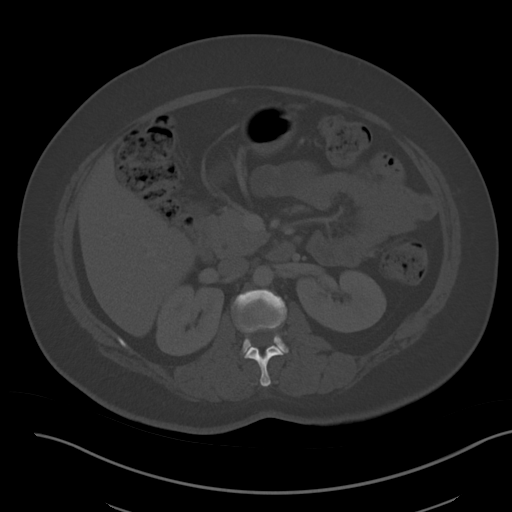
[im 71/99  soft-tissue]
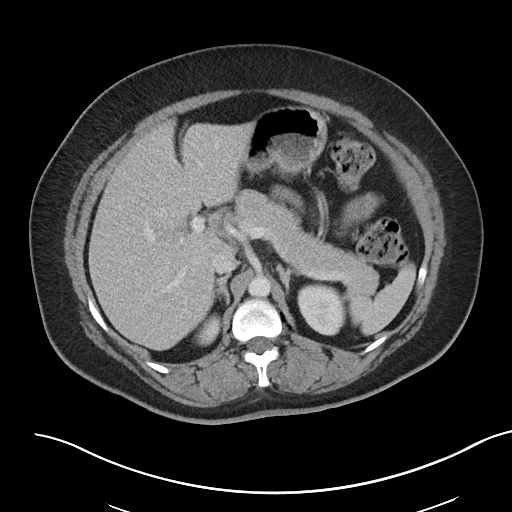
[im 78/99  soft-tissue]
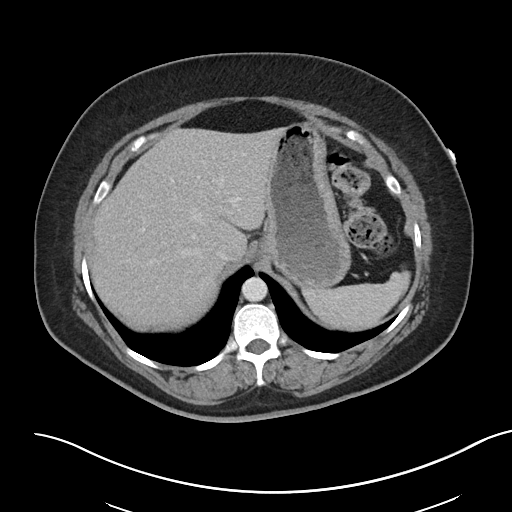
[im 85/99  soft-tissue]
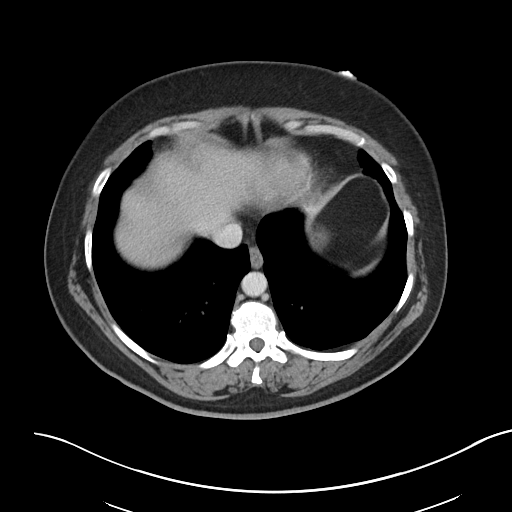
[im 92/99  soft-tissue]
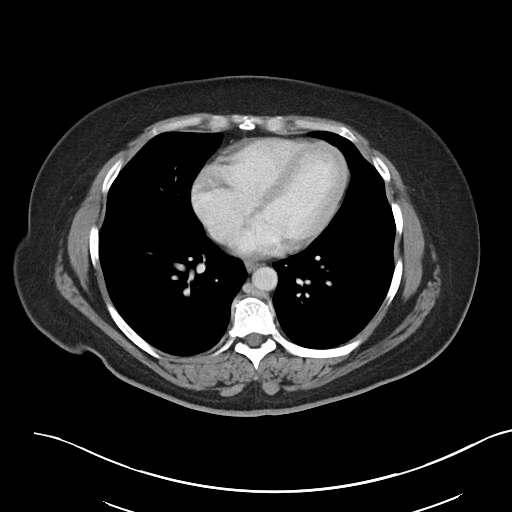

[Series 5: abdomen 3.0 mpr cor · coronal · 0.94mm/px · 3 of 104 slices shown]
[im 35/104  soft-tissue]
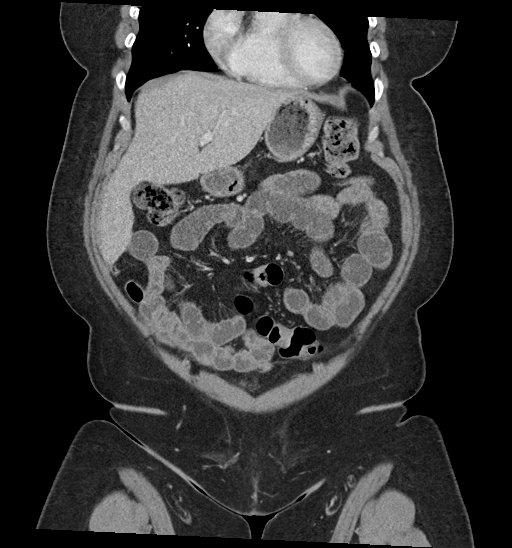
[im 46/104  soft-tissue]
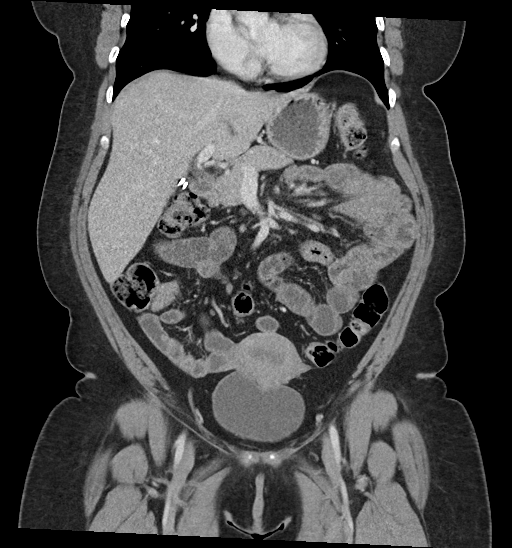
[im 58/104  soft-tissue]
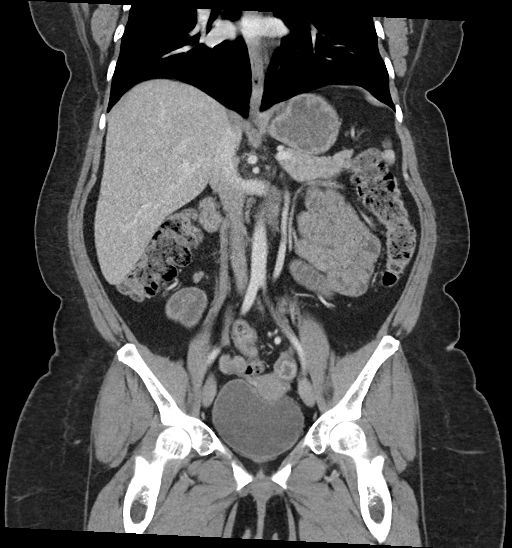

[16 of 46 positions shown; findings below may reference images not displayed]

RADIATION DOSE REDUCTION: This exam was performed according to the
departmental dose-optimization program which includes automated
exposure control, adjustment of the mA and/or kV according to
patient size and/or use of iterative reconstruction technique.

CONTRAST:  100mL OMNIPAQUE IOHEXOL 300 MG/ML  SOLN
FINDINGS: Lower Chest: No acute findings.

Hepatobiliary: No hepatic masses identified. Prior cholecystectomy.
No evidence of biliary obstruction.

Pancreas:  No mass or inflammatory changes.

Spleen: Within normal limits in size and appearance.

Adrenals/Urinary Tract: No masses identified. No evidence of
ureteral calculi or hydronephrosis.

Stomach/Bowel: No evidence of obstruction, inflammatory process or
abnormal fluid collections. Normal appendix visualized.

Vascular/Lymphatic: No pathologically enlarged lymph nodes. No acute
vascular findings.

Reproductive:  No mass or other significant abnormality.

Other:  None.

Musculoskeletal:  No suspicious bone lesions identified.
IMPRESSION: Negative. No acute findings or other significant abnormality.

## 2023-01-16 IMAGING — US US ABDOMEN LIMITED
1 series · 14 of 25 positions shown · non-contrast
Comparison: None Available.

CLINICAL DATA: Epigastric abdominal pain. Status post
cholecystectomy.

EXAM:
ULTRASOUND ABDOMEN LIMITED RIGHT UPPER QUADRANT

[Series 1: us abdomen limited ruq (liver/gb) · 14 of 31 slices shown]
[im 1/31]
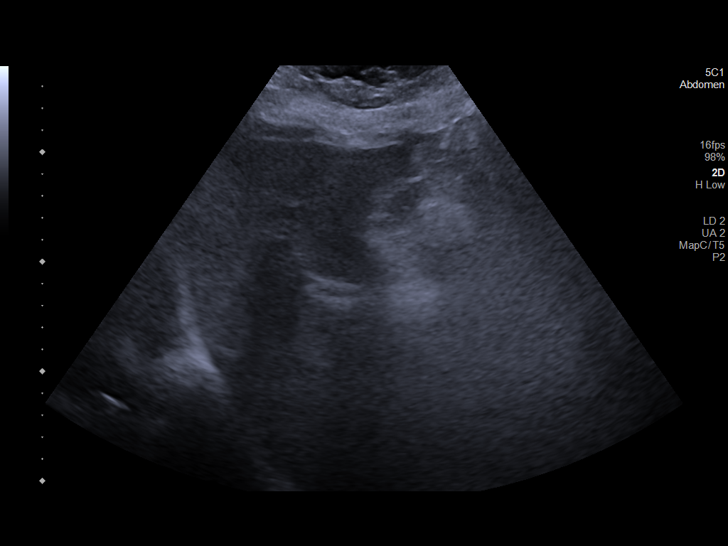
[im 3/31]
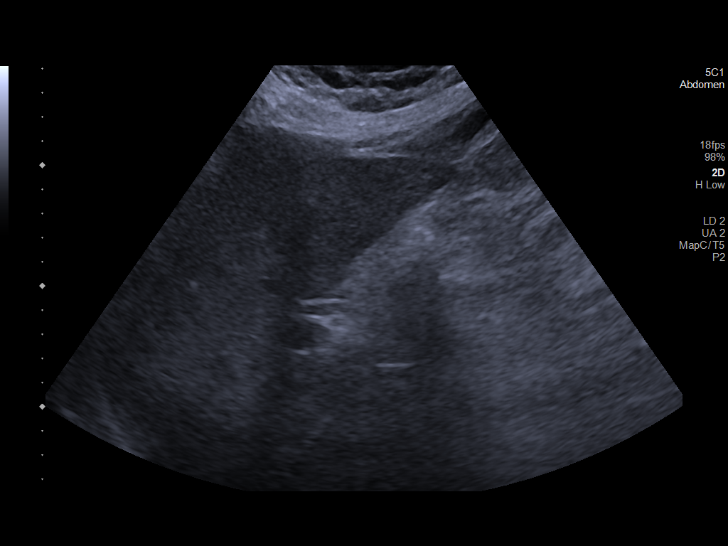
[im 6/31]
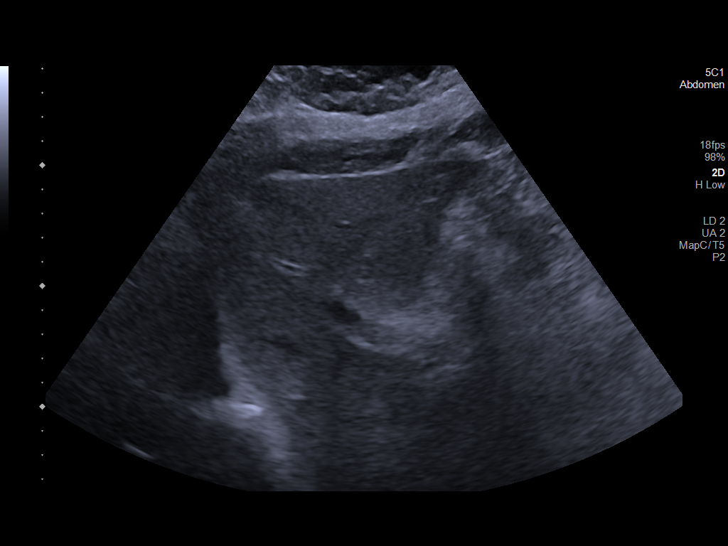
[im 8/31]
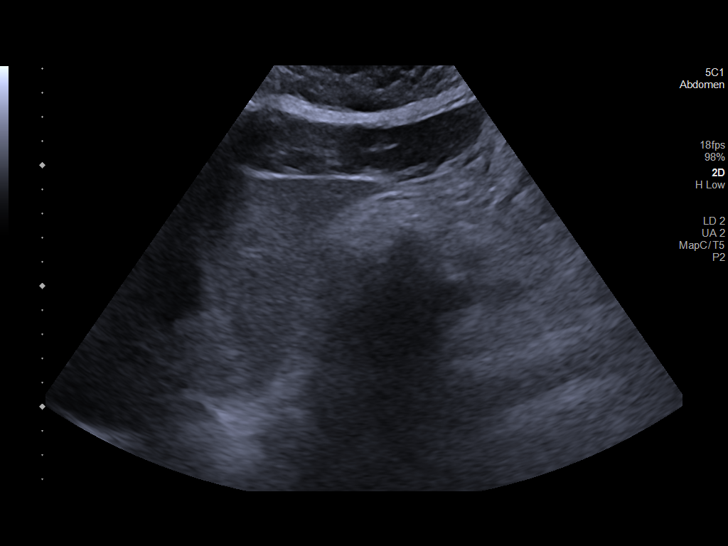
[im 11/31]
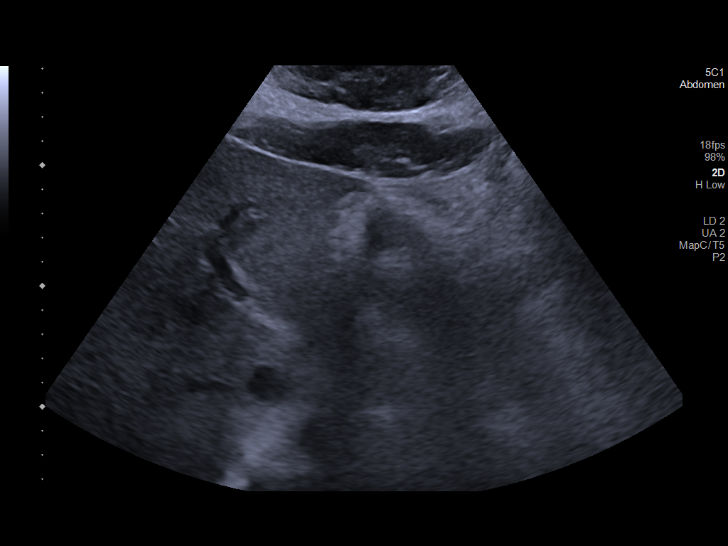
[im 12/31]
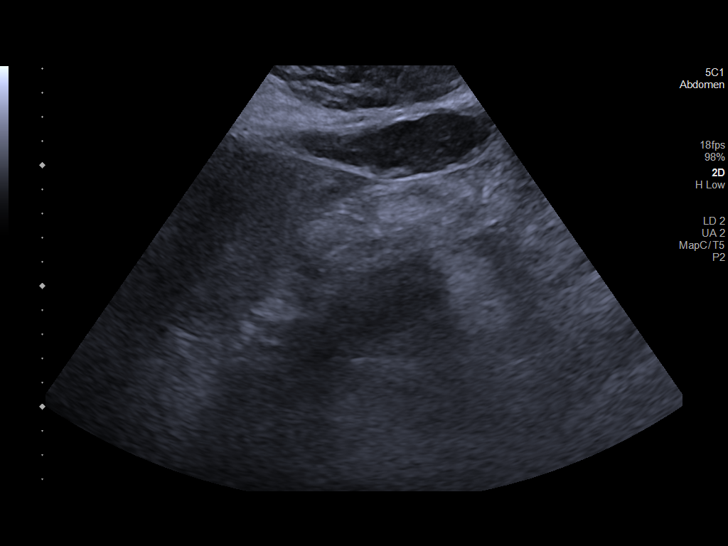
[im 14/31]
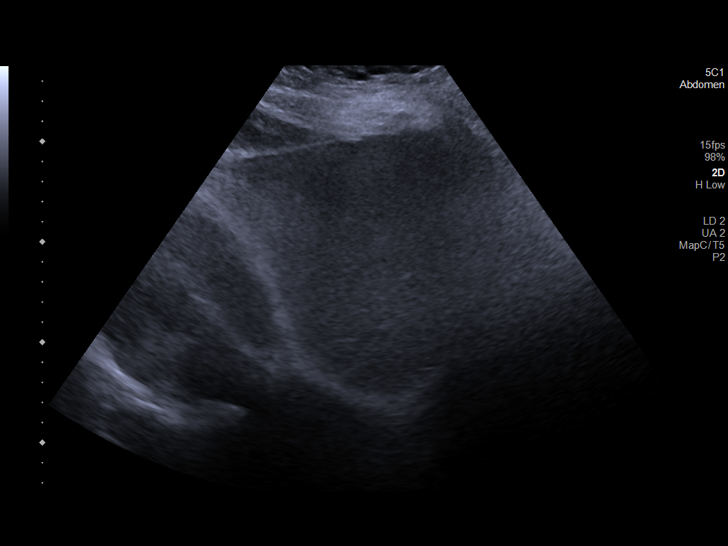
[im 17/31]
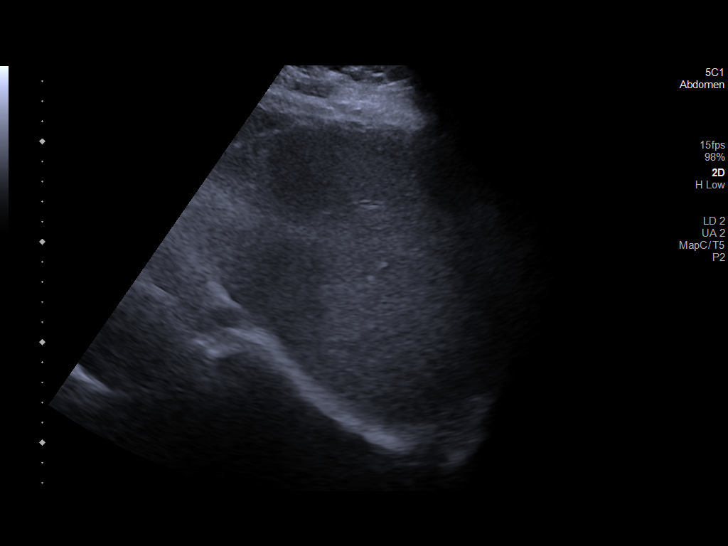
[im 19/31]
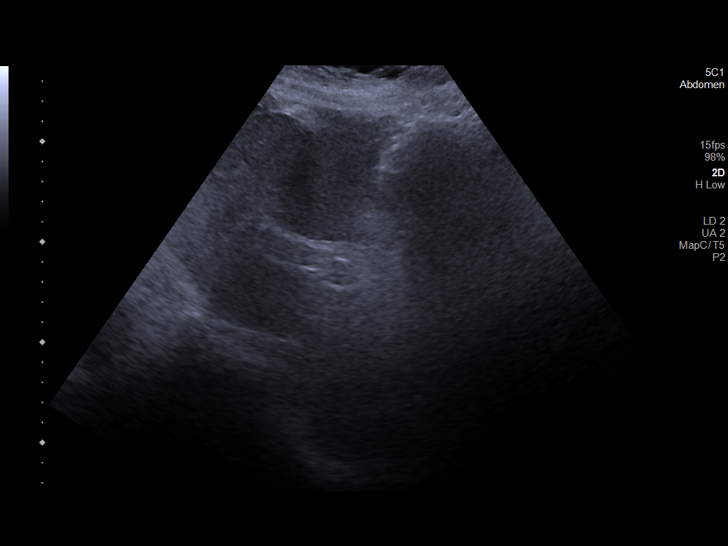
[im 21/31]
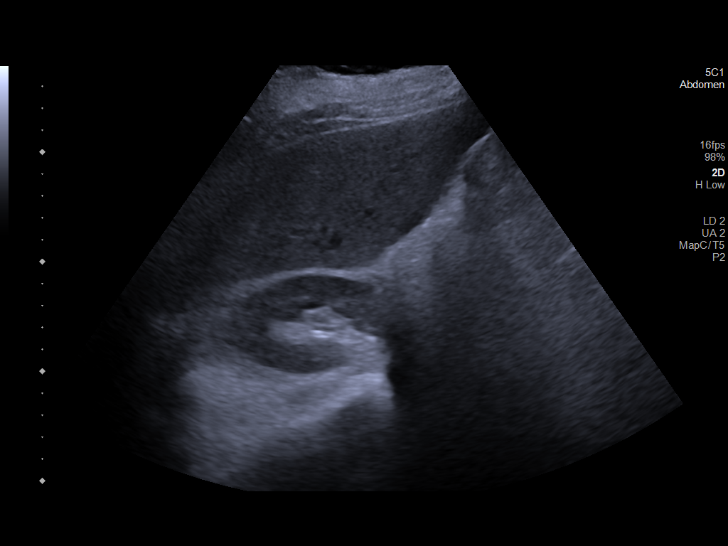
[im 23/31]
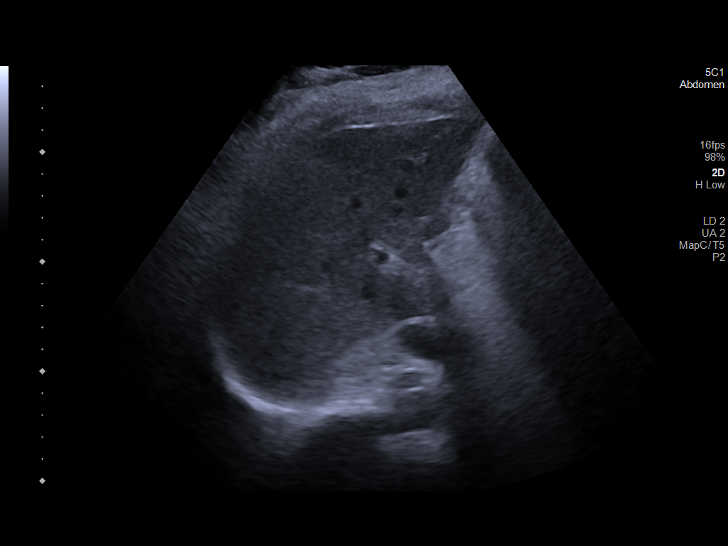
[im 26/31]
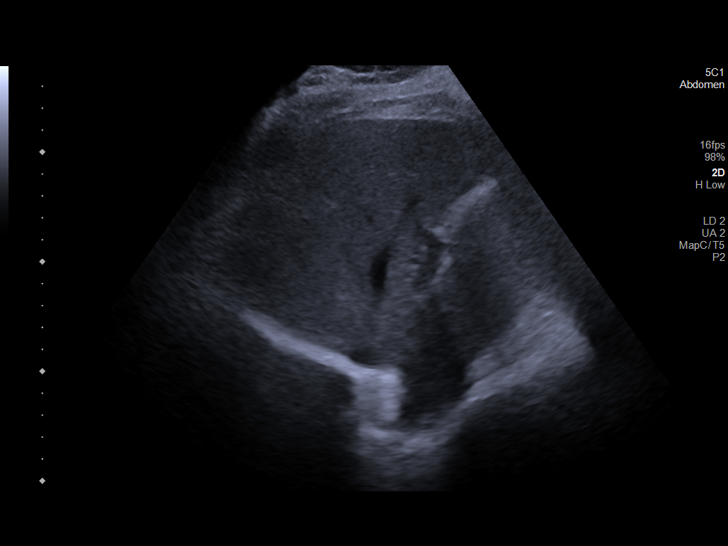
[im 28/31]
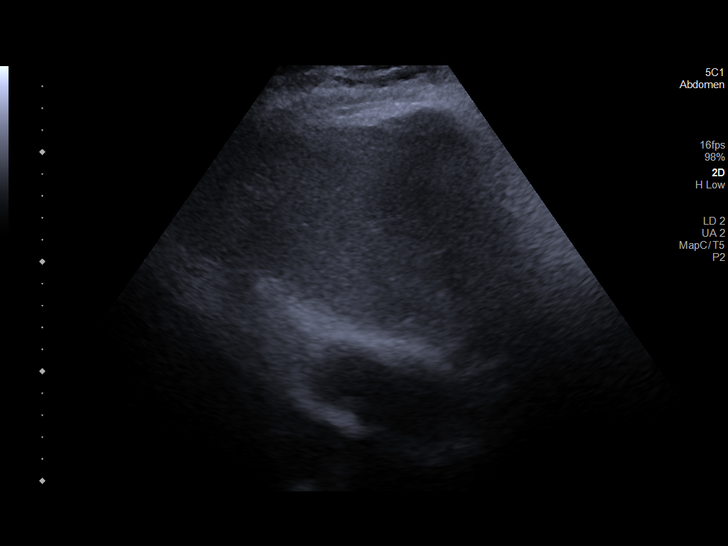
[im 31/31]
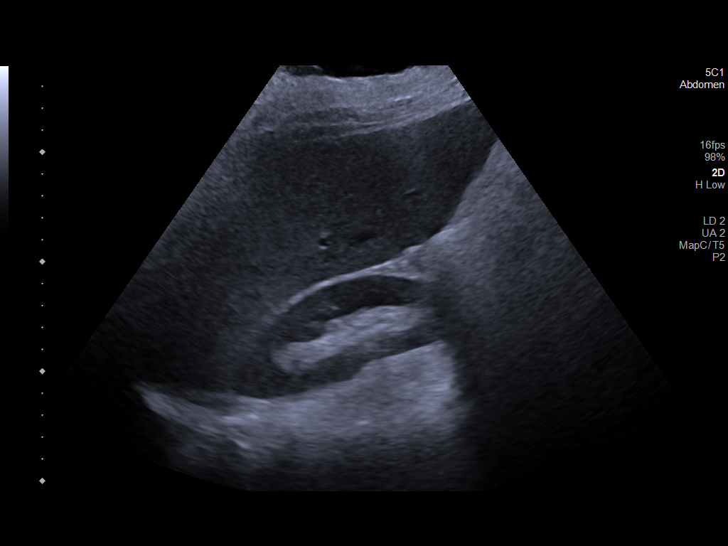

[14 of 25 positions shown; findings below may reference images not displayed]

FINDINGS: Gallbladder:

Surgically absent.

Common bile duct:

Diameter: 6 mm, within normal limits. No intrahepatic biliary ductal
dilatation.

Liver:

Moderately increased echogenicity throughout the liver compatible
with fatty infiltration. Smooth liver contours. No focal liver
lesion is seen. Portal vein is patent on color Doppler imaging with
normal direction of blood flow towards the liver.

Other: None.
IMPRESSION: :
IMPRESSION: 1. Status post cholecystectomy.
2. Fatty liver.

## 2023-10-08 ENCOUNTER — Other Ambulatory Visit: Payer: Self-pay | Admitting: Obstetrics and Gynecology

## 2023-10-08 DIAGNOSIS — Z1231 Encounter for screening mammogram for malignant neoplasm of breast: Secondary | ICD-10-CM

## 2023-10-19 ENCOUNTER — Ambulatory Visit
Admission: RE | Admit: 2023-10-19 | Discharge: 2023-10-19 | Disposition: A | Discharge status: Home / Self Care | Payer: BC Managed Care – PPO | Source: Ambulatory Visit | Attending: Obstetrics and Gynecology | Admitting: Obstetrics and Gynecology

## 2023-10-19 DIAGNOSIS — Z1231 Encounter for screening mammogram for malignant neoplasm of breast: Secondary | ICD-10-CM | POA: Diagnosis present

## 2023-12-02 ENCOUNTER — Ambulatory Visit: Payer: Self-pay

## 2023-12-02 DIAGNOSIS — Z719 Counseling, unspecified: Secondary | ICD-10-CM

## 2023-12-02 DIAGNOSIS — Z23 Encounter for immunization: Secondary | ICD-10-CM

## 2023-12-02 NOTE — Progress Notes (Signed)
 In nurse clinic for immunizations for immigration. Vaccines given today Hep B,MMR,varicella.Polio and tdap.Tolerated vaccines well. VIS provided. [facility] updated and copies given to patient.

## 2024-01-31 ENCOUNTER — Other Ambulatory Visit: Payer: Self-pay | Admitting: Family Medicine

## 2024-01-31 DIAGNOSIS — M5416 Radiculopathy, lumbar region: Secondary | ICD-10-CM

## 2024-02-02 ENCOUNTER — Other Ambulatory Visit

## 2024-02-03 ENCOUNTER — Ambulatory Visit
Admission: RE | Admit: 2024-02-03 | Discharge: 2024-02-03 | Disposition: A | Discharge status: Home / Self Care | Source: Ambulatory Visit | Attending: Family Medicine | Admitting: Family Medicine

## 2024-02-03 DIAGNOSIS — M5416 Radiculopathy, lumbar region: Secondary | ICD-10-CM

## 2024-06-30 ENCOUNTER — Ambulatory Visit
Admission: RE | Admit: 2024-06-30 | Discharge: 2024-06-30 | Disposition: A | Discharge status: Home / Self Care | Attending: Gastroenterology | Admitting: Gastroenterology

## 2024-06-30 ENCOUNTER — Encounter: Payer: Self-pay | Admitting: Gastroenterology

## 2024-06-30 ENCOUNTER — Encounter
Admission: RE | Disposition: A | Discharge status: Home / Self Care | Payer: Self-pay | Source: Home / Self Care | Attending: Gastroenterology

## 2024-06-30 ENCOUNTER — Ambulatory Visit: Admitting: Certified Registered"

## 2024-06-30 ENCOUNTER — Other Ambulatory Visit: Payer: Self-pay

## 2024-06-30 DIAGNOSIS — E66813 Obesity, class 3: Secondary | ICD-10-CM | POA: Insufficient documentation

## 2024-06-30 DIAGNOSIS — R7303 Prediabetes: Secondary | ICD-10-CM | POA: Insufficient documentation

## 2024-06-30 DIAGNOSIS — Z1211 Encounter for screening for malignant neoplasm of colon: Secondary | ICD-10-CM | POA: Diagnosis present

## 2024-06-30 DIAGNOSIS — Z7984 Long term (current) use of oral hypoglycemic drugs: Secondary | ICD-10-CM | POA: Insufficient documentation

## 2024-06-30 HISTORY — PX: COLONOSCOPY: SHX5424

## 2024-06-30 LAB — POCT PREGNANCY, URINE: Preg Test, Ur: NEGATIVE

## 2024-06-30 SURGERY — COLONOSCOPY
Anesthesia: General

## 2024-06-30 MED ORDER — PROPOFOL 10 MG/ML IV BOLUS
INTRAVENOUS | Status: DC | PRN
  Administered 2024-06-30: 50 mg via INTRAVENOUS
  Administered 2024-06-30: 120 mg via INTRAVENOUS
  Administered 2024-06-30: 40 mg via INTRAVENOUS
  Administered 2024-06-30: 50 mg via INTRAVENOUS
  Administered 2024-06-30: 40 mg via INTRAVENOUS

## 2024-06-30 MED ORDER — LIDOCAINE HCL (CARDIAC) PF 100 MG/5ML IV SOSY
PREFILLED_SYRINGE | INTRAVENOUS | Status: DC | PRN
  Administered 2024-06-30: 50 mg via INTRAVENOUS

## 2024-06-30 MED ORDER — SODIUM CHLORIDE 0.9 % IV SOLN: INTRAVENOUS | Status: DC

## 2024-06-30 NOTE — H&P (Signed)
 Becky JONELLE Brooklyn, MD [facility], [address]  [place], [place] [address]  Main: [phone] Fax:  [phone] Pager: [phone]   Primary Care Physician:  Sadie Manna, MD Primary Gastroenterologist:  Dr. Corinn JONELLE Garrett  Pre-Procedure History & Physical: HPI:  Becky Garrett is a 48 y.o. female is here for an colonoscopy.   Past Medical History:  Diagnosis Date   Pre-diabetes    Thyroid  disease     Past Surgical History:  Procedure Laterality Date   CHOLECYSTECTOMY      Prior to Admission medications   Medication Sig Start Date End Date Taking? Authorizing Provider  metFORMIN (GLUCOPHAGE) 500 MG tablet TAKE 1 TABLET BY MOUTH EVERY MORNING AND TAKE 2 TABLETS BY MOUTH EVERY EVENING 10/07/21  Yes [provider]  famotidine  (PEPCID ) 20 MG tablet Take 1 tablet (20 mg total) by mouth 2 (two) times daily. 01/16/22 01/16/23  Menshew, Candida LULLA Kings, PA-C  ondansetron  (ZOFRAN -ODT) 4 MG disintegrating tablet Take 1 tablet (4 mg total) by mouth every 8 (eight) hours as needed for nausea or vomiting. 01/16/22   Menshew, Candida LULLA Kings, PA-C    Allergies as of 06/10/2024   (No Known Allergies)    Family History  Problem Relation Age of Onset   Breast cancer Neg Hx     Social History   Socioeconomic History   Marital status: Single    Spouse name: Not on file   Number of children: Not on file   Years of education: Not on file   Highest education level: Not on file  Occupational History   Not on file  Tobacco Use   Smoking status: Never   Smokeless tobacco: Never  Vaping Use   Vaping status: Never Used  Substance and Sexual Activity   Alcohol use: No   Drug use: No   Sexual activity: Yes  Other Topics Concern   Not on file  Social History Narrative   Not on file   Social Drivers of Health   Financial Resource Strain: Low Risk  (01/31/2024)   Received from [facility]   Overall Financial  Resource Strain (CARDIA)    Difficulty of Paying Living Expenses: Not hard at all  Food Insecurity: No Food Insecurity (01/31/2024)   Received from [facility]   Hunger Vital Sign    Within the past 12 months, you worried that your food would run out before you got the money to buy more.: Never true    Within the past 12 months, the food you bought just didn't last and you didn't have money to get more.: Never true  Transportation Needs: No Transportation Needs (01/31/2024)   Received from [facility] - Transportation    In the past 12 months, has lack of transportation kept you from medical appointments or from getting medications?: No    Lack of Transportation (Non-Medical): No  Physical Activity: Not on file  Stress: Not on file  Social Connections: Not on file  Intimate Partner Violence: Not on file    Review of Systems: See HPI, otherwise negative ROS  Physical Exam: BP 133/84   Pulse 78   Temp (!) 97.3 F (36.3 C) (Tympanic)   Resp 18   Ht 5' (1.524 m)   Wt 92.8 kg   LMP 06/14/2024   SpO2 100%   BMI 39.96 kg/m  General:   Alert,  pleasant and cooperative in NAD Head:  Normocephalic and atraumatic. Neck:  Supple; no masses or thyromegaly. Lungs:  Clear throughout to auscultation.    Heart:  Regular rate and rhythm. Abdomen:  Soft, nontender and nondistended. Normal bowel sounds, without guarding, and without rebound.   Neurologic:  Alert and  oriented x4;  grossly normal neurologically.  Impression/Plan: Becky Garrett is here for an colonoscopy to be performed for colon cancer screening  Risks, benefits, limitations, and alternatives regarding  colonoscopy have been reviewed with the patient.  Questions have been answered.  All parties agreeable.   Becky Brooklyn, MD  06/30/2024, 8:56 AM

## 2024-06-30 NOTE — Op Note (Signed)
 [facility] Patient Name: Becky Garrett Procedure Date: 06/30/2024 9:01 AM MRN: [id number] Account #: [id number] Date of Birth: Aug 05, 1976 Admit Type: Outpatient Age: 48 Room: [facility] ENDO ROOM 4 Gender: Female Note Status: Finalized Instrument Name: Colon Scope [id number] Procedure:             Colonoscopy Indications:           Screening for colorectal malignant neoplasm, This is                         the patient's first colonoscopy Providers:             Corinn Jess Brooklyn MD, MD Medicines:             General Anesthesia Complications:         No immediate complications. Estimated blood loss: None. Procedure:             Pre-Anesthesia Assessment:                        - Prior to the procedure, a History and Physical was                         performed, and patient medications and allergies were                         reviewed. The patient is competent. The risks and                         benefits of the procedure and the sedation options and                         risks were discussed with the patient. All questions                         were answered and informed consent was obtained.                         Patient identification and proposed procedure were                         verified by the physician, the nurse, the                         anesthesiologist, the anesthetist and the technician                         in the pre-procedure area in the procedure room in the                         endoscopy suite. Mental Status Examination: alert and                         oriented. Airway Examination: normal oropharyngeal                         airway and neck mobility. Respiratory Examination:                         clear  to auscultation. CV Examination: normal.                         Prophylactic Antibiotics: The patient does not require                         prophylactic antibiotics. Prior Anticoagulants: The                          patient has taken no anticoagulant or antiplatelet                         agents. ASA Grade Assessment: II - A patient with mild                         systemic disease. After reviewing the risks and                         benefits, the patient was deemed in satisfactory                         condition to undergo the procedure. The anesthesia                         plan was to use general anesthesia. Immediately prior                         to administration of medications, the patient was                         re-assessed for adequacy to receive sedatives. The                         heart rate, respiratory rate, oxygen saturations,                         blood pressure, adequacy of pulmonary ventilation, and                         response to care were monitored throughout the                         procedure. The physical status of the patient was                         re-assessed after the procedure.                        After obtaining informed consent, the colonoscope was                         passed under direct vision. Throughout the procedure,                         the patient's blood pressure, pulse, and oxygen                         saturations were monitored continuously. The  Colonoscope was introduced through the anus and                         advanced to the the terminal ileum, with                         identification of the appendiceal orifice and IC                         valve. The colonoscopy was performed without                         difficulty. The patient tolerated the procedure well.                         The quality of the bowel preparation was evaluated                         using the BBPS [facility] Bowel Preparation Scale) with                         scores of: Right Colon = 3, Transverse Colon = 3 and                         Left Colon = 3 (entire mucosa seen well with no                          residual staining, small fragments of stool or opaque                         liquid). The total BBPS score equals 9. The terminal                         ileum, ileocecal valve, appendiceal orifice, and                         rectum were photographed. Findings:      The perianal and digital rectal examinations were normal. Pertinent       negatives include normal sphincter tone and no palpable rectal lesions.      The terminal ileum appeared normal.      The entire examined colon appeared normal.      The retroflexed view of the distal rectum and anal verge was normal and       showed no anal or rectal abnormalities. Impression:            - The examined portion of the ileum was normal.                        - The entire examined colon is normal.                        - The distal rectum and anal verge are normal on                         retroflexion view.                        -  No specimens collected. Recommendation:        - Discharge patient to home (with escort).                        - Resume previous diet today.                        - Continue present medications.                        - Repeat colonoscopy in 10 years for screening                         purposes. Procedure Code(s):     --- Professional ---                        H9878, Colorectal cancer screening; colonoscopy on                         individual not meeting criteria for high risk Diagnosis Code(s):     --- Professional ---                        Z12.11, Encounter for screening for malignant neoplasm                         of colon CPT copyright 2022 [employer]. All rights reserved. The codes documented in this report are preliminary and upon coder review may  be revised to meet current compliance requirements. Dr. [name] Corinn Jess Brooklyn MD, MD 06/30/2024 9:26:43 AM This report has been signed electronically. Number of Addenda: 0 Note Initiated On: 06/30/2024  9:01 AM Scope Withdrawal Time: 0 hours 7 minutes 21 seconds  Total Procedure Duration: 0 hours 9 minutes 46 seconds  Estimated Blood Loss:  Estimated blood loss: none.      [facility]

## 2024-06-30 NOTE — Anesthesia Postprocedure Evaluation (Signed)
 Anesthesia Post Note  Patient: Becky Garrett Nap  Procedure(s) Performed: COLONOSCOPY  Patient location during evaluation: Endoscopy Anesthesia Type: General Level of consciousness: awake and alert Pain management: pain level controlled Vital Signs Assessment: post-procedure vital signs reviewed and stable Respiratory status: spontaneous breathing, nonlabored ventilation, respiratory function stable and patient connected to nasal cannula oxygen Cardiovascular status: blood pressure returned to baseline and stable Postop Assessment: no apparent nausea or vomiting Anesthetic complications: no   No notable events documented.   Last Vitals:  Vitals:   06/30/24 0927 06/30/24 0937  BP: 106/87 (!) 129/94  Pulse: 87   Resp: (!) 21 13  Temp:    SpO2: 100% 99%    Last Pain:  Vitals:   06/30/24 0937  TempSrc:   PainSc: 0-No pain                 Debby Mines

## 2024-06-30 NOTE — Transfer of Care (Signed)
 Immediate Anesthesia Transfer of Care Note  Patient: Becky Garrett  Procedure(s) Performed: COLONOSCOPY  Patient Location: Endoscopy Unit  Anesthesia Type:General  Level of Consciousness: drowsy  Airway & Oxygen Therapy: Patient Spontanous Breathing  Post-op Assessment: Report given to RN, Post -op Vital signs reviewed and stable, and Patient moving all extremities  Post vital signs: Reviewed and stable  Last Vitals:  Vitals Value Taken Time  BP 106/87 06/30/24 09:27  Temp    Pulse 88 06/30/24 09:29  Resp 18 06/30/24 09:30  SpO2 100 % 06/30/24 09:29  Vitals shown include unfiled device data.  Last Pain:  Vitals:   06/30/24 0833  TempSrc: Tympanic         Complications: No notable events documented.

## 2024-06-30 NOTE — Anesthesia Preprocedure Evaluation (Signed)
 Anesthesia Evaluation  Patient identified by MRN, date of birth, ID band Patient awake    Reviewed: Allergy & Precautions, NPO status , Patient's Chart, lab work & pertinent test results  Airway Mallampati: III  TM Distance: >3 FB Neck ROM: full    Dental  (+) Chipped   Pulmonary neg pulmonary ROS   Pulmonary exam normal        Cardiovascular negative cardio ROS Normal cardiovascular exam     Neuro/Psych negative neurological ROS  negative psych ROS   GI/Hepatic negative GI ROS, Neg liver ROS,,,  Endo/Other    Class 3 obesity  Renal/GU negative Renal ROS  negative genitourinary   Musculoskeletal   Abdominal   Peds  Hematology negative hematology ROS (+)   Anesthesia Other Findings Past Medical History: No date: Pre-diabetes No date: Thyroid  disease  Past Surgical History: No date: CHOLECYSTECTOMY  BMI    Body Mass Index: 39.96 kg/m      Reproductive/Obstetrics negative OB ROS                              Anesthesia Physical Anesthesia Plan  ASA: 3  Anesthesia Plan: General   Post-op Pain Management: Minimal or no pain anticipated   Induction: Intravenous  PONV Risk Score and Plan: 2 and Propofol infusion and TIVA  Airway Management Planned: Nasal Cannula  Additional Equipment: None  Intra-op Plan:   Post-operative Plan:   Informed Consent: I have reviewed the patients History and Physical, chart, labs and discussed the procedure including the risks, benefits and alternatives for the proposed anesthesia with the patient or authorized representative who has indicated his/her understanding and acceptance.     Dental advisory given  Plan Discussed with: CRNA and Surgeon  Anesthesia Plan Comments: (Discussed risks of anesthesia with patient, including possibility of difficulty with spontaneous ventilation under anesthesia necessitating airway intervention, PONV,  and rare risks such as cardiac or respiratory or neurological events, and allergic reactions. Discussed the role of CRNA in patient's perioperative care. Patient understands.)        Anesthesia Quick Evaluation

## 2024-10-16 ENCOUNTER — Other Ambulatory Visit: Payer: Self-pay | Admitting: Internal Medicine

## 2024-10-16 DIAGNOSIS — Z1231 Encounter for screening mammogram for malignant neoplasm of breast: Secondary | ICD-10-CM

## 2024-11-08 ENCOUNTER — Encounter
